# Patient Record
Sex: Female | Born: 1975 | Race: White | Hispanic: No | Marital: Single | Smoking: Never smoker
Health system: Southern US, Community
[De-identification: ages and names within clinical notes are randomized; demographics above are authoritative.]

## PROBLEM LIST (undated history)

## (undated) DIAGNOSIS — E039 Hypothyroidism, unspecified: Secondary | ICD-10-CM

## (undated) DIAGNOSIS — Q969 Turner's syndrome, unspecified: Secondary | ICD-10-CM

## (undated) DIAGNOSIS — M810 Age-related osteoporosis without current pathological fracture: Secondary | ICD-10-CM

## (undated) DIAGNOSIS — I10 Essential (primary) hypertension: Secondary | ICD-10-CM

## (undated) DIAGNOSIS — A0472 Enterocolitis due to Clostridium difficile, not specified as recurrent: Secondary | ICD-10-CM

## (undated) HISTORY — DX: Enterocolitis due to Clostridium difficile, not specified as recurrent: A04.72

## (undated) HISTORY — PX: NASAL SINUS SURGERY: SHX719

## (undated) HISTORY — DX: Age-related osteoporosis without current pathological fracture: M81.0

## (undated) HISTORY — PX: OTHER SURGICAL HISTORY: SHX169

## (undated) HISTORY — DX: Hypothyroidism, unspecified: E03.9

## (undated) HISTORY — DX: Essential (primary) hypertension: I10

## (undated) HISTORY — DX: Turner's syndrome, unspecified: Q96.9

---

## 1997-07-16 ENCOUNTER — Other Ambulatory Visit: Admission: RE | Admit: 1997-07-16 | Discharge: 1997-07-16 | Payer: Self-pay | Admitting: Obstetrics and Gynecology

## 1997-09-08 ENCOUNTER — Other Ambulatory Visit: Admission: RE | Admit: 1997-09-08 | Discharge: 1997-09-08 | Payer: Self-pay | Admitting: Otolaryngology

## 1998-09-20 ENCOUNTER — Other Ambulatory Visit: Admission: RE | Admit: 1998-09-20 | Discharge: 1998-09-20 | Payer: Self-pay | Admitting: Obstetrics and Gynecology

## 1999-10-02 ENCOUNTER — Other Ambulatory Visit: Admission: RE | Admit: 1999-10-02 | Discharge: 1999-10-02 | Payer: Self-pay | Admitting: Obstetrics and Gynecology

## 2000-11-29 ENCOUNTER — Other Ambulatory Visit: Admission: RE | Admit: 2000-11-29 | Discharge: 2000-11-29 | Payer: Self-pay | Admitting: Obstetrics and Gynecology

## 2001-12-02 ENCOUNTER — Other Ambulatory Visit: Admission: RE | Admit: 2001-12-02 | Discharge: 2001-12-02 | Payer: Self-pay | Admitting: Obstetrics and Gynecology

## 2003-03-09 ENCOUNTER — Other Ambulatory Visit: Admission: RE | Admit: 2003-03-09 | Discharge: 2003-03-09 | Payer: Self-pay | Admitting: Obstetrics and Gynecology

## 2004-07-10 ENCOUNTER — Other Ambulatory Visit: Admission: RE | Admit: 2004-07-10 | Discharge: 2004-07-10 | Payer: Self-pay | Admitting: Obstetrics and Gynecology

## 2005-07-17 ENCOUNTER — Other Ambulatory Visit: Admission: RE | Admit: 2005-07-17 | Discharge: 2005-07-17 | Payer: Self-pay | Admitting: Obstetrics and Gynecology

## 2006-07-22 ENCOUNTER — Other Ambulatory Visit: Admission: RE | Admit: 2006-07-22 | Discharge: 2006-07-22 | Payer: Self-pay | Admitting: Obstetrics & Gynecology

## 2007-08-06 ENCOUNTER — Other Ambulatory Visit: Admission: RE | Admit: 2007-08-06 | Discharge: 2007-08-06 | Payer: Self-pay | Admitting: Obstetrics and Gynecology

## 2008-10-01 ENCOUNTER — Encounter: Payer: Self-pay | Admitting: Cardiovascular Disease

## 2009-01-22 DIAGNOSIS — A0472 Enterocolitis due to Clostridium difficile, not specified as recurrent: Secondary | ICD-10-CM | POA: Insufficient documentation

## 2009-01-22 HISTORY — DX: Enterocolitis due to Clostridium difficile, not specified as recurrent: A04.72

## 2009-02-04 ENCOUNTER — Ambulatory Visit: Payer: Self-pay | Admitting: Family Medicine

## 2009-02-04 ENCOUNTER — Ambulatory Visit: Payer: Self-pay | Admitting: Pulmonary Disease

## 2009-02-04 ENCOUNTER — Inpatient Hospital Stay (HOSPITAL_COMMUNITY): Admission: EM | Admit: 2009-02-04 | Discharge: 2009-02-10 | Payer: Self-pay | Admitting: Family Medicine

## 2009-02-06 ENCOUNTER — Encounter: Payer: Self-pay | Admitting: Family Medicine

## 2009-02-07 ENCOUNTER — Ambulatory Visit: Payer: Self-pay | Admitting: Gastroenterology

## 2010-04-09 LAB — PREGNANCY, URINE: Preg Test, Ur: NEGATIVE

## 2010-04-09 LAB — CLOSTRIDIUM DIFFICILE EIA: C difficile Toxins A+B, EIA: NEGATIVE

## 2010-04-09 LAB — HEPATIC FUNCTION PANEL
ALT: 11 U/L (ref 0–35)
AST: 16 U/L (ref 0–37)
Albumin: 2.6 g/dL — ABNORMAL LOW (ref 3.5–5.2)
Bilirubin, Direct: <0.1 mg/dL (ref 0.0–0.3)
Total Bilirubin: 0.3 mg/dL (ref 0.3–1.2)

## 2010-04-09 LAB — CBC
Hemoglobin: 11.9 g/dL — ABNORMAL LOW (ref 12.0–15.0)
MCHC: 34.4 g/dL (ref 30.0–36.0)
MCV: 91.6 fL (ref 78.0–100.0)
Platelets: 298 10*3/uL (ref 150–400)
RBC: 3.6 MIL/uL — ABNORMAL LOW (ref 3.87–5.11)
RDW: 12.2 % (ref 11.5–15.5)
RDW: 12.9 % (ref 11.5–15.5)
WBC: 13.5 10*3/uL — ABNORMAL HIGH (ref 4.0–10.5)

## 2010-04-09 LAB — STOOL CULTURE

## 2010-04-09 LAB — BASIC METABOLIC PANEL
BUN: 3 mg/dL — ABNORMAL LOW (ref 6–23)
BUN: 3 mg/dL — ABNORMAL LOW (ref 6–23)
CO2: 23 mEq/L (ref 19–32)
CO2: 24 mEq/L (ref 19–32)
Calcium: 7.8 mg/dL — ABNORMAL LOW (ref 8.4–10.5)
Chloride: 102 mEq/L (ref 96–112)
Creatinine, Ser: 0.69 mg/dL (ref 0.4–1.2)
GFR calc Af Amer: >60 mL/min (ref >60)
Glucose, Bld: 104 mg/dL — ABNORMAL HIGH (ref 70–99)
Sodium: 136 mEq/L (ref 135–145)

## 2010-04-10 LAB — BASIC METABOLIC PANEL
BUN: <1 mg/dL — ABNORMAL LOW (ref 6–23)
CO2: 15 mEq/L — ABNORMAL LOW (ref 19–32)
CO2: 15 mEq/L — ABNORMAL LOW (ref 19–32)
CO2: 18 mEq/L — ABNORMAL LOW (ref 19–32)
CO2: 20 mEq/L (ref 19–32)
CO2: 22 mEq/L (ref 19–32)
Calcium: 5.7 mg/dL — CL (ref 8.4–10.5)
Calcium: 7.2 mg/dL — ABNORMAL LOW (ref 8.4–10.5)
Chloride: 111 mEq/L (ref 96–112)
Chloride: 114 mEq/L — ABNORMAL HIGH (ref 96–112)
Chloride: 117 mEq/L — ABNORMAL HIGH (ref 96–112)
Creatinine, Ser: 0.5 mg/dL (ref 0.4–1.2)
Creatinine, Ser: 0.54 mg/dL (ref 0.4–1.2)
Creatinine, Ser: 0.66 mg/dL (ref 0.4–1.2)
Creatinine, Ser: 0.69 mg/dL (ref 0.4–1.2)
GFR calc Af Amer: >60 mL/min (ref >60)
GFR calc Af Amer: >60 mL/min (ref >60)
GFR calc Af Amer: >60 mL/min (ref >60)
GFR calc Af Amer: >60 mL/min (ref >60)
GFR calc non Af Amer: >60 mL/min (ref >60)
GFR calc non Af Amer: >60 mL/min (ref >60)
GFR calc non Af Amer: >60 mL/min (ref >60)
Glucose, Bld: 101 mg/dL — ABNORMAL HIGH (ref 70–99)
Glucose, Bld: 130 mg/dL — ABNORMAL HIGH (ref 70–99)
Glucose, Bld: 130 mg/dL — ABNORMAL HIGH (ref 70–99)
Potassium: 3 mEq/L — ABNORMAL LOW (ref 3.5–5.1)
Potassium: 3.1 mEq/L — ABNORMAL LOW (ref 3.5–5.1)
Potassium: 3.6 mEq/L (ref 3.5–5.1)
Sodium: 137 mEq/L (ref 135–145)
Sodium: 137 mEq/L (ref 135–145)
Sodium: 138 mEq/L (ref 135–145)
Sodium: 138 mEq/L (ref 135–145)

## 2010-04-10 LAB — CBC
HCT: 32.2 % — ABNORMAL LOW (ref 36.0–46.0)
HCT: 32.8 % — ABNORMAL LOW (ref 36.0–46.0)
HCT: 36.4 % (ref 36.0–46.0)
Hemoglobin: 10.8 g/dL — ABNORMAL LOW (ref 12.0–15.0)
Hemoglobin: 11.3 g/dL — ABNORMAL LOW (ref 12.0–15.0)
Hemoglobin: 11.4 g/dL — ABNORMAL LOW (ref 12.0–15.0)
Hemoglobin: 12.1 g/dL (ref 12.0–15.0)
MCHC: 34.8 g/dL (ref 30.0–36.0)
MCV: 90.7 fL (ref 78.0–100.0)
MCV: 91.8 fL (ref 78.0–100.0)
MCV: 93.7 fL (ref 78.0–100.0)
Platelets: 309 10*3/uL (ref 150–400)
RBC: 3.5 MIL/uL — ABNORMAL LOW (ref 3.87–5.11)
RBC: 3.5 MIL/uL — ABNORMAL LOW (ref 3.87–5.11)
RBC: 3.62 MIL/uL — ABNORMAL LOW (ref 3.87–5.11)
RDW: 13 % (ref 11.5–15.5)
RDW: 13.3 % (ref 11.5–15.5)
WBC: 11.9 10*3/uL — ABNORMAL HIGH (ref 4.0–10.5)
WBC: 17.2 10*3/uL — ABNORMAL HIGH (ref 4.0–10.5)
WBC: 17.8 10*3/uL — ABNORMAL HIGH (ref 4.0–10.5)
WBC: 8.6 10*3/uL (ref 4.0–10.5)

## 2010-04-10 LAB — COMPREHENSIVE METABOLIC PANEL
AST: 30 U/L (ref 0–37)
BUN: <1 mg/dL — ABNORMAL LOW (ref 6–23)
CO2: 23 mEq/L (ref 19–32)
Chloride: 111 mEq/L (ref 96–112)
Creatinine, Ser: 0.49 mg/dL (ref 0.4–1.2)
GFR calc non Af Amer: >60 mL/min (ref >60)
Glucose, Bld: 127 mg/dL — ABNORMAL HIGH (ref 70–99)
Total Bilirubin: 0.5 mg/dL (ref 0.3–1.2)

## 2010-04-10 LAB — HEPATIC FUNCTION PANEL
ALT: 18 U/L (ref 0–35)
Alkaline Phosphatase: 53 U/L (ref 39–117)
Total Bilirubin: 0.2 mg/dL — ABNORMAL LOW (ref 0.3–1.2)
Total Protein: 4.4 g/dL — ABNORMAL LOW (ref 6.0–8.3)

## 2010-04-10 LAB — MRSA PCR SCREENING

## 2010-04-10 LAB — CLOSTRIDIUM DIFFICILE EIA

## 2010-04-10 LAB — CALCIUM, IONIZED: Calcium, Ion: 0.91 mmol/L — ABNORMAL LOW (ref 1.12–1.32)

## 2010-04-10 LAB — PHOSPHORUS
Phosphorus: 1.6 mg/dL — ABNORMAL LOW (ref 2.3–4.6)
Phosphorus: 2 mg/dL — ABNORMAL LOW (ref 2.3–4.6)

## 2010-04-10 LAB — ALBUMIN: Albumin: 2 g/dL — ABNORMAL LOW (ref 3.5–5.2)

## 2010-04-10 LAB — PROTIME-INR: INR: 1.47 (ref 0.00–1.49)

## 2010-04-10 LAB — T4, FREE: Free T4: 1.39 ng/dL (ref 0.80–1.80)

## 2010-04-10 LAB — TSH: TSH: 2.143 u[IU]/mL (ref 0.350–4.500)

## 2010-12-28 ENCOUNTER — Ambulatory Visit (INDEPENDENT_AMBULATORY_CARE_PROVIDER_SITE_OTHER): Payer: BC Managed Care – PPO

## 2010-12-28 DIAGNOSIS — R059 Cough, unspecified: Secondary | ICD-10-CM

## 2010-12-28 DIAGNOSIS — R05 Cough: Secondary | ICD-10-CM

## 2010-12-28 DIAGNOSIS — R509 Fever, unspecified: Secondary | ICD-10-CM

## 2010-12-28 DIAGNOSIS — J111 Influenza due to unidentified influenza virus with other respiratory manifestations: Secondary | ICD-10-CM

## 2011-05-12 ENCOUNTER — Ambulatory Visit (INDEPENDENT_AMBULATORY_CARE_PROVIDER_SITE_OTHER): Payer: BC Managed Care – PPO | Admitting: Family Medicine

## 2011-05-12 ENCOUNTER — Encounter: Payer: Self-pay | Admitting: Family Medicine

## 2011-05-12 VITALS — BP 113/80 | HR 112 | Temp 98.2°F | Resp 16 | Ht <= 58 in | Wt 150.4 lb

## 2011-05-12 DIAGNOSIS — J309 Allergic rhinitis, unspecified: Secondary | ICD-10-CM

## 2011-05-12 DIAGNOSIS — J329 Chronic sinusitis, unspecified: Secondary | ICD-10-CM

## 2011-05-12 DIAGNOSIS — Z9109 Other allergy status, other than to drugs and biological substances: Secondary | ICD-10-CM

## 2011-05-12 MED ORDER — DOXYCYCLINE HYCLATE 100 MG PO TABS: 100.0000 mg | ORAL_TABLET | Freq: Two times a day (BID) | ORAL | Status: AC

## 2011-05-12 MED ORDER — FLUTICASONE PROPIONATE 50 MCG/ACT NA SUSP: 2.0000 | Freq: Every day | NASAL | Status: DC

## 2011-05-12 MED ORDER — METHYLPREDNISOLONE 4 MG PO KIT: PACK | ORAL | Status: AC

## 2011-05-12 NOTE — Patient Instructions (Signed)

## 2011-05-12 NOTE — Progress Notes (Signed)
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 No fever.  No h/o asthma.  Positive h/o allergies.  Positive for C. Diff  O:  NAD, hoarse, overweight.  Turner's syndrome habitus Eyes: reddened lid margins and mild conjunctival injection TM's:  Markedly deformed left Oroph: clear Nose: purulent nasal discharge Neck: supple,no adenop or thyromegaly Chest: clear  A: sinusitis, allergies  P:  Medrol dospak, flonase Call if no better in 24 hours

## 2011-06-23 ENCOUNTER — Ambulatory Visit (INDEPENDENT_AMBULATORY_CARE_PROVIDER_SITE_OTHER): Payer: BC Managed Care – PPO | Admitting: Emergency Medicine

## 2011-06-23 VITALS — BP 131/92 | HR 108 | Temp 99.5°F | Resp 16 | Ht <= 58 in | Wt 155.0 lb

## 2011-06-23 DIAGNOSIS — J329 Chronic sinusitis, unspecified: Secondary | ICD-10-CM

## 2011-06-23 MED ORDER — DOXYCYCLINE HYCLATE 100 MG PO CAPS: 100.0000 mg | ORAL_CAPSULE | Freq: Two times a day (BID) | ORAL | Status: AC

## 2011-06-23 NOTE — Progress Notes (Signed)
  Subjective:    Patient ID: Mercedes Barry, female    DOB: Jan 06, 1976, 36 y.o.   MRN: [id number]  HPI patient was brought to 6 weeks ago and treated as a sinus infection. She now has recurrent symptoms or purulent nasal drainage associated with a sore throat low-grade fever. She has had a mild cough. She has a history of previous sinus surgery she has a history of C. difficile while taking Levaquin 2-1/2 years ago    Review of Systems     Objective:   Physical Exam  Constitutional: She appears well-developed and well-nourished.  HENT:       There is scarring present both TMs. There is purulent drainage in the nares worse on the left .  Eyes: Pupils are equal, round, and reactive to light.  Neck: Normal range of motion. No thyromegaly present.  Pulmonary/Chest: Effort normal. No respiratory distress. She has no wheezes. She has no rales.          Assessment & Plan:  Patient has acute sinusitis. Will treat doxy x2 weeks. She is to be on probiotics at the same time. We'll make an outpatient referral to Dr. Lazarus Salines to evaluate because of her recurrent episodes.

## 2012-03-18 ENCOUNTER — Ambulatory Visit (INDEPENDENT_AMBULATORY_CARE_PROVIDER_SITE_OTHER): Payer: BC Managed Care – PPO | Admitting: Family Medicine

## 2012-03-18 VITALS — BP 128/84 | HR 91 | Temp 98.5°F | Resp 16 | Ht <= 58 in | Wt 113.0 lb

## 2012-03-18 DIAGNOSIS — Z9109 Other allergy status, other than to drugs and biological substances: Secondary | ICD-10-CM

## 2012-03-18 DIAGNOSIS — J019 Acute sinusitis, unspecified: Secondary | ICD-10-CM

## 2012-03-18 MED ORDER — IPRATROPIUM BROMIDE 0.03 % NA SOLN: 2.0000 | Freq: Four times a day (QID) | NASAL | Status: DC

## 2012-03-18 MED ORDER — FLUTICASONE PROPIONATE 50 MCG/ACT NA SUSP: 2.0000 | Freq: Every day | NASAL | Status: DC

## 2012-03-18 MED ORDER — AZITHROMYCIN 250 MG PO TABS: ORAL_TABLET | ORAL | Status: DC

## 2012-03-18 NOTE — Progress Notes (Signed)
Subjective:    Patient ID: Mercedes Barry, female    DOB: [birth year], 37 y.o.   MRN: [id number] Chief Complaint  Patient presents with  . Headache    all symptoms x 2 weeks  . sinus pressure  . Generalized Body Aches  . Fatigue    HPI  Thinks she has a sinus infection.  Sxs began 2 wks ago - she tried to fight it off, was sleeping a lot but sxs just never went away fully and this morning felt worse. Felt feverish and chilled but no documented temp. Lots of HAs, nasal congestion, sinus pressure, and ears clogged. Does have a sore throat and a dry cough. No teeth pain. No SHoB or CP.  H/O c. Diff after antibiotics for a sinus infection about 3 yrs ago - was hospitalized for long time, close to death!  Past Medical History  Diagnosis Date  . Turner syndrome   . Hypertension   . Hypothyroidism   . Vitamin D deficiency   . Osteoporosis    Current Outpatient Prescriptions on File Prior to Visit  Medication Sig Dispense Refill  . Calcium Carbonate-Vitamin D (CALCIUM 600 + D PO) Take by mouth 2 (two) times daily.      Marland Kitchen desogestrel-ethinyl estradiol (APRI,EMOQUETTE,SOLIA) 0.15-30 MG-MCG tablet Take 1 tablet by mouth daily.      Marland Kitchen levothyroxine (SYNTHROID, LEVOTHROID) 100 MCG tablet Take 100 mcg by mouth daily.      . Multiple Vitamin (MULTIVITAMIN) tablet Take 1 tablet by mouth daily.      . Vitamin D, Ergocalciferol, (DRISDOL) 50000 UNITS CAPS Take 50,000 Units by mouth.      Marland Kitchen alendronate (FOSAMAX) 70 MG tablet Take 70 mg by mouth every 7 (seven) days. Take with a full glass of water on an empty stomach.      Marland Kitchen lisinopril-hydrochlorothiazide (PRINZIDE,ZESTORETIC) 10-12.5 MG per tablet Take 1 tablet by mouth daily.       No current facility-administered medications on file prior to visit.   Allergies  Allergen Reactions  . Cephalosporins   . Entex   . Penicillins        Review of Systems  Constitutional: Positive for chills, diaphoresis, activity change, appetite change and  fatigue. Negative for fever.  HENT: Positive for ear pain, congestion, sore throat, rhinorrhea, sneezing, postnasal drip and sinus pressure. Negative for nosebleeds, trouble swallowing, neck pain, neck stiffness, dental problem, voice change and ear discharge.   Eyes: Negative for discharge and itching.  Respiratory: Positive for cough. Negative for shortness of breath.   Cardiovascular: Negative for chest pain.  Gastrointestinal: Negative for nausea, vomiting, abdominal pain, diarrhea and constipation.  Skin: Negative for rash.  Neurological: Positive for headaches. Negative for dizziness and syncope.  Hematological: Positive for adenopathy.  Psychiatric/Behavioral: Positive for sleep disturbance.      BP 128/84  Pulse 91  Temp(Src) 98.5 F (36.9 C) (Oral)  Resp 16  Ht 4\' 7"  (1.397 m)  Wt 113 lb (51.256 kg)  BMI 26.26 kg/m2  SpO2 99%  LMP 02/26/2012 Objective:   Physical Exam  Constitutional: She is oriented to person, place, and time. She appears well-developed and well-nourished. She is active. She does not appear ill. No distress.  HENT:  Head: Normocephalic and atraumatic.  Right Ear: External ear and ear canal normal. Tympanic membrane is retracted. A middle ear effusion is present.  Left Ear: External ear and ear canal normal. Tympanic membrane is retracted. A middle ear effusion is present.  Nose: Mucosal  edema and rhinorrhea present. Right sinus exhibits maxillary sinus tenderness. Left sinus exhibits maxillary sinus tenderness.  Mouth/Throat: Uvula is midline and mucous membranes are normal. Posterior oropharyngeal erythema present. No oropharyngeal exudate, posterior oropharyngeal edema or tonsillar abscesses.  Eyes: Conjunctivae are normal. Right eye exhibits no discharge. Left eye exhibits no discharge. No scleral icterus.  Neck: Normal range of motion. Neck supple.  Cardiovascular: Normal rate, regular rhythm, normal heart sounds and intact distal pulses.    Pulmonary/Chest: Effort normal and breath sounds normal.  Lymphadenopathy:       Head (right side): Submandibular adenopathy present. No preauricular and no posterior auricular adenopathy present.       Head (left side): Submandibular adenopathy present. No preauricular and no posterior auricular adenopathy present.    She has no cervical adenopathy.       Right: No supraclavicular adenopathy present.       Left: No supraclavicular adenopathy present.  Neurological: She is alert and oriented to person, place, and time.  Skin: Skin is warm and dry. She is not diaphoretic. No erythema.  Psychiatric: She has a normal mood and affect. Her behavior is normal.      Assessment & Plan:  Sinusitis, acute - Plan: azithromycin (ZITHROMAX) 250 MG tablet, fluticasone (FLONASE) 50 MCG/ACT nasal spray, ipratropium (ATROVENT) 0.03 % nasal spray  Meds ordered this encounter  Medications         . azithromycin (ZITHROMAX) 250 MG tablet    Sig: Take 2 tabs PO x 1 dose, then 1 tab PO QD x 4 days    Dispense:  6 tablet    Refill:  0  . fluticasone (FLONASE) 50 MCG/ACT nasal spray    Sig: Place 2 sprays into the nose daily.    Dispense:  1 g    Refill:  0  . ipratropium (ATROVENT) 0.03 % nasal spray    Sig: Place 2 sprays into the nose 4 (four) times daily.    Dispense:  30 mL    Refill:  1

## 2012-03-18 NOTE — Patient Instructions (Signed)
Hot showers or breathing in steam may help loosen the congestion.  Using a netti pot or sinus rinse is also likely to help you feel better and keep this from progressing.  Use the atrovent nasal spray as needed throughout the day and use the fluticasone nasal spray every night before bed for at least 2 weeks. If no improvement or you are getting worse, come back as you might need a course of steroids but hopefully with all of the above, you can avoid it.   Sinusitis Sinusitis is redness, soreness, and swelling (inflammation) of the paranasal sinuses. Paranasal sinuses are air pockets within the bones of your face (beneath the eyes, the middle of the forehead, or above the eyes). In healthy paranasal sinuses, mucus is able to drain out, and air is able to circulate through them by way of your nose. However, when your paranasal sinuses are inflamed, mucus and air can become trapped. This can allow bacteria and other germs to grow and cause infection. Sinusitis can develop quickly and last only a short time (acute) or continue over a long period (chronic). Sinusitis that lasts for more than 12 weeks is considered chronic.  CAUSES  Causes of sinusitis include:  Allergies.  Structural abnormalities, such as displacement of the cartilage that separates your nostrils (deviated septum), which can decrease the air flow through your nose and sinuses and affect sinus drainage.  Functional abnormalities, such as when the small hairs (cilia) that line your sinuses and help remove mucus do not work properly or are not present. SYMPTOMS  Symptoms of acute and chronic sinusitis are the same. The primary symptoms are pain and pressure around the affected sinuses. Other symptoms include:  Upper toothache.  Earache.  Headache.  Bad breath.  Decreased sense of smell and taste.  A cough, which worsens when you are lying flat.  Fatigue.  Fever.  Thick drainage from your nose, which often is green and may  contain pus (purulent).  Swelling and warmth over the affected sinuses. DIAGNOSIS  Your caregiver will perform a physical exam. During the exam, your caregiver may:  Look in your nose for signs of abnormal growths in your nostrils (nasal polyps).  Tap over the affected sinus to check for signs of infection.  View the inside of your sinuses (endoscopy) with a special imaging device with a light attached (endoscope), which is inserted into your sinuses. If your caregiver suspects that you have chronic sinusitis, one or more of the following tests may be recommended:  Allergy tests.  Nasal culture A sample of mucus is taken from your nose and sent to a lab and screened for bacteria.  Nasal cytology A sample of mucus is taken from your nose and examined by your caregiver to determine if your sinusitis is related to an allergy. TREATMENT  Most cases of acute sinusitis are related to a viral infection and will resolve on their own within 10 days. Sometimes medicines are prescribed to help relieve symptoms (pain medicine, decongestants, nasal steroid sprays, or saline sprays).  However, for sinusitis related to a bacterial infection, your caregiver will prescribe antibiotic medicines. These are medicines that will help kill the bacteria causing the infection.  Rarely, sinusitis is caused by a fungal infection. In theses cases, your caregiver will prescribe antifungal medicine. For some cases of chronic sinusitis, surgery is needed. Generally, these are cases in which sinusitis recurs more than 3 times per year, despite other treatments. HOME CARE INSTRUCTIONS   Drink plenty of  water. Water helps thin the mucus so your sinuses can drain more easily.  Use a humidifier.  Inhale steam 3 to 4 times a day (for example, sit in the bathroom with the shower running).  Apply a warm, moist washcloth to your face 3 to 4 times a day, or as directed by your caregiver.  Use saline nasal sprays to help  moisten and clean your sinuses.  Take over-the-counter or prescription medicines for pain, discomfort, or fever only as directed by your caregiver. SEEK IMMEDIATE MEDICAL CARE IF:  You have increasing pain or severe headaches.  You have nausea, vomiting, or drowsiness.  You have swelling around your face.  You have vision problems.  You have a stiff neck.  You have difficulty breathing. MAKE SURE YOU:   Understand these instructions.  Will watch your condition.  Will get help right away if you are not doing well or get worse. Document Released: 01/08/2005 Document Revised: 04/02/2011 Document Reviewed: 01/23/2011 [facility] Patient Information 2013 [place], [place].

## 2012-04-16 ENCOUNTER — Telehealth: Payer: Self-pay | Admitting: Obstetrics & Gynecology

## 2012-04-16 NOTE — Telephone Encounter (Signed)
PT. DID NOT WISH TO LEAVE A MSG Mercedes Barry

## 2012-07-15 ENCOUNTER — Encounter: Payer: Self-pay | Admitting: Obstetrics & Gynecology

## 2012-07-16 ENCOUNTER — Ambulatory Visit: Payer: BC Managed Care – PPO | Admitting: Obstetrics & Gynecology

## 2012-07-18 ENCOUNTER — Encounter: Payer: Self-pay | Admitting: Obstetrics & Gynecology

## 2012-07-18 ENCOUNTER — Ambulatory Visit (INDEPENDENT_AMBULATORY_CARE_PROVIDER_SITE_OTHER): Payer: BC Managed Care – PPO | Admitting: Obstetrics & Gynecology

## 2012-07-18 ENCOUNTER — Telehealth: Payer: Self-pay | Admitting: Obstetrics & Gynecology

## 2012-07-18 VITALS — BP 118/56 | HR 68 | Resp 12 | Ht <= 58 in | Wt 129.6 lb

## 2012-07-18 DIAGNOSIS — Z0000 Encounter for general adult medical examination without abnormal findings: Secondary | ICD-10-CM

## 2012-07-18 DIAGNOSIS — Q969 Turner's syndrome, unspecified: Secondary | ICD-10-CM

## 2012-07-18 DIAGNOSIS — Z202 Contact with and (suspected) exposure to infections with a predominantly sexual mode of transmission: Secondary | ICD-10-CM

## 2012-07-18 DIAGNOSIS — Z01419 Encounter for gynecological examination (general) (routine) without abnormal findings: Secondary | ICD-10-CM

## 2012-07-18 DIAGNOSIS — Z124 Encounter for screening for malignant neoplasm of cervix: Secondary | ICD-10-CM

## 2012-07-18 LAB — HEMOGLOBIN, FINGERSTICK: Hemoglobin, fingerstick: 13.6 g/dL (ref 12.0–16.0)

## 2012-07-18 LAB — RPR

## 2012-07-18 MED ORDER — DESOGESTREL-ETHINYL ESTRADIOL 0.15-30 MG-MCG PO TABS: 1.0000 | ORAL_TABLET | Freq: Every day | ORAL | Status: DC

## 2012-07-18 NOTE — Patient Instructions (Signed)

## 2012-07-18 NOTE — Telephone Encounter (Signed)
Pt wondering if dr Hyacinth Meeker sent her Reclipsen to pharmacy. [facility] on [address] [phone]

## 2012-07-18 NOTE — Progress Notes (Signed)
37 y.o. G0P0000 Single Caucasian Fe here for annual exam.  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  These students stay with her for all three years.  Off for the summer.  Resting and just enjoying being out of school.  Friend/colleague at [employer] has been missing since 6/8.  Patient was last person to see colleague except for boyfriend.    Patient is dating a person.  They are discussing moving in together.  Sexually active.  Bleeds and has pain each time.  Would like to discuss this.  Not using condoms but reports partner has been tested.  Has tried lubricants--[facility] brand.  With weight loss, went off blood pressure but needed still a little so back on but on a lower dosage.  Has had labs with Dr. Yehuda Budd in [place].   Patient's last menstrual period was 07/16/2012.          Sexually active: yes  The current method of family planning is OCP (estrogen/progesterone).    Exercising: yes  workout at gym 2-3x weekly Smoker:  no  Health Maintenance: Pap:  06/14/2011  Negative, neg HR HPV MMG:  never Colonoscopy:  2011 (Sigmoidoscopy with Dr. Loreta Ave, C. Diff) BMD:   07/28/2010 -2.2/-2.8 TDaP:  Pt unsure Labs: hgb- 13.6   reports that she has quit smoking. She has never used smokeless tobacco. She reports that she does not drink alcohol or use illicit drugs.  Past Medical History  Diagnosis Date  . Turner syndrome   . Hypertension   . Hypothyroidism   . Vitamin D deficiency   . Osteoporosis   . Clostridium difficile colitis     Past Surgical History  Procedure Laterality Date  . Left ear reconstruction x2    . Sigmoidoscopy  1/11    [facility])    Current Outpatient Prescriptions  Medication Sig Dispense Refill  . desogestrel-ethinyl estradiol (APRI,EMOQUETTE,SOLIA) 0.15-30 MG-MCG tablet Take 1 tablet by mouth daily.      Marland Kitchen ipratropium (ATROVENT) 0.03 % nasal spray Place 2 sprays into the nose 4 (four) times daily.  30 mL  1  . levothyroxine  (SYNTHROID, LEVOTHROID) 100 MCG tablet Take 100 mcg by mouth daily.      Marland Kitchen lisinopril-hydrochlorothiazide (PRINZIDE,ZESTORETIC) 10-12.5 MG per tablet Take 1 tablet by mouth daily.      . Probiotic Product (PROBIOTIC DAILY PO) Take by mouth.       No current facility-administered medications for this visit.    Family History  Problem Relation Age of Onset  . Osteoporosis Mother     ROS:  Pertinent items are noted in HPI.  Otherwise, a comprehensive ROS was negative.  Exam:   BP 118/56  Pulse 68  Resp 12  Ht 4\' 10"  (1.473 m)  Wt 129 lb 9.6 oz (58.786 kg)  BMI 27.09 kg/m2  LMP 07/16/2012 Height: 4\' 10"  (147.3 cm)  Weight -18lbs from last year Ht Readings from Last 3 Encounters:  07/18/12 4\' 10"  (1.473 m)  03/18/12 4\' 7"  (1.397 m)  06/23/11 4' 9.5" (1.461 m)    General appearance: alert, cooperative and appears stated age Head: Normocephalic, without obvious abnormality, atraumatic Neck: no adenopathy, supple, symmetrical, trachea midline and thyroid normal to inspection and palpation Lungs: clear to auscultation bilaterally Breasts: normal appearance, no masses or tenderness Heart: regular rate and rhythm Abdomen: soft, non-tender; no masses,  no organomegaly Extremities: extremities normal, atraumatic, no cyanosis or edema Skin: Skin color, texture, turgor normal. No rashes or lesions Lymph  nodes: Cervical, supraclavicular, and axillary nodes normal. No abnormal inguinal nodes palpated Neurologic: Grossly normal   Pelvic: External genitalia:  no lesions              Urethra:  normal appearing urethra with no masses, tenderness or lesions              Bartholin's and Skene's: normal                 Vagina: normal appearing vagina with normal color and discharge, no lesions              Cervix: no lesions              Pap taken: no Bimanual Exam:  Uterus:  normal size, contour, position, consistency, mobility, non-tender              Adnexa: normal adnexa and no mass,  fullness, tenderness               Rectovaginal: Confirms               Anus:  normal sphincter tone, no lesions  A:  Well Woman with normal exam Turner's syndrome Osteoporosis H/O C. Diff colitis Hypothyroidism Hearing loss Dyspareunia  On OCPs for HRT/Cycling  P:   Pap smear with neg HR HPV.  Pap today due to new sex partner.  Mammogram recommended starting at age 76 Refer to cardiologist this year for echo to assess aorta.  Pt aware at risk for dissecting aortic aneurysm. Patient knows tetanus is probably due.  She will follow up with Dr. Yehuda Budd. Declines today. BMD next year. Will check about ordering dilators for patient due to painful intercourse. STD testing done today--RPR, HIV, Hep B SAg, GC/Chl. OCP RX to pharmacy.  D/W pt switching to HRT around age 1. return annually or prn  An After Visit Summary was printed and given to the patient.

## 2012-07-18 NOTE — Telephone Encounter (Signed)
Left message on voice mail that yes Dr Hyacinth Meeker sent [facility] to [facility].

## 2012-07-22 LAB — IPS N GONORRHOEA AND CHLAMYDIA BY PCR

## 2012-07-22 NOTE — Telephone Encounter (Signed)
Called patient to schedule Dr. Katherina Right. Patient had wrong phone #. Called [facility]. No appt. With Dr. Elease Hashimoto till Sept. Patient notified of this and request with another doctor who would be available before school started. Appointment given for [facility]ers [facility] for August 19th @ 2:30pm. Patient notified and agreed to this date and time.

## 2012-07-22 NOTE — Telephone Encounter (Signed)
Patient calling to let Dr. Hyacinth Meeker know she prefers [facility], [phone], Dr. Leodis Sias.

## 2012-07-23 ENCOUNTER — Telehealth: Payer: Self-pay

## 2012-07-23 LAB — IPS PAP SMEAR ONLY

## 2012-07-23 NOTE — Telephone Encounter (Signed)
Message copied by Elisha Headland on Wed Jul 23, 2012  4:56 PM ------      Message from: Jerene Bears      Created: Tue Jul 22, 2012  5:18 PM       Inform all std testing negative ------

## 2012-07-23 NOTE — Telephone Encounter (Signed)
Patient notified of all results. 

## 2012-07-23 NOTE — Telephone Encounter (Signed)
7/2 lmtcb//kn

## 2012-07-26 ENCOUNTER — Ambulatory Visit (INDEPENDENT_AMBULATORY_CARE_PROVIDER_SITE_OTHER): Payer: BC Managed Care – PPO | Admitting: Family Medicine

## 2012-07-26 VITALS — BP 110/70 | HR 117 | Temp 98.0°F | Resp 18 | Ht <= 58 in | Wt 130.0 lb

## 2012-07-26 DIAGNOSIS — J329 Chronic sinusitis, unspecified: Secondary | ICD-10-CM

## 2012-07-26 DIAGNOSIS — H9190 Unspecified hearing loss, unspecified ear: Secondary | ICD-10-CM

## 2012-07-26 DIAGNOSIS — H9192 Unspecified hearing loss, left ear: Secondary | ICD-10-CM

## 2012-07-26 DIAGNOSIS — J029 Acute pharyngitis, unspecified: Secondary | ICD-10-CM

## 2012-07-26 DIAGNOSIS — J020 Streptococcal pharyngitis: Secondary | ICD-10-CM

## 2012-07-26 DIAGNOSIS — R0982 Postnasal drip: Secondary | ICD-10-CM

## 2012-07-26 DIAGNOSIS — J019 Acute sinusitis, unspecified: Secondary | ICD-10-CM

## 2012-07-26 LAB — POCT RAPID STREP A (OFFICE): Rapid Strep A Screen: POSITIVE — AB

## 2012-07-26 MED ORDER — AZITHROMYCIN 250 MG PO TABS: ORAL_TABLET | ORAL | Status: DC

## 2012-07-26 MED ORDER — FLUTICASONE PROPIONATE 50 MCG/ACT NA SUSP: 2.0000 | Freq: Every day | NASAL | Status: DC

## 2012-07-26 NOTE — Progress Notes (Signed)
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 Yesterday she got feeling much worse. She's had some sore throat. She has facial pain. Nothing much is coming out. She feels like it's just draining down her throat. Her ears are not giving her any acute problem. She is not having much in the way of cough. She lives alone. She has had sinus problems are currently in the past. She does not smoke. She is also regular medications as noted in her chart. She has a history of having Clostridium difficile in 2011.  Objective: Short stature lady, average weight, in no acute distress. Her TMs are normal on the right. Has a lot of scarring of the left eardrum from previous ear surgery. Nose is congested. Throat has mild tenderness postnasal drainage down the right side primarily right now. Her chest is clear to auscultation. Heart regular without murmurs.  Assessment: Sinusitis with secondary pharyngitis  Plan:   will do a strep test on her.  Results for orders placed in visit on 07/26/12  POCT RAPID STREP A (OFFICE)      Result Value Range   Rapid Strep A Screen Positive (*) Negative   Surprisingly she does have strep along with sinusitis.

## 2012-07-26 NOTE — Patient Instructions (Addendum)
Drink plenty of fluids  Get sufficient rest  You can take an over-the-counter antihistamine decongestant such as Allegra-D or Claritin-D one daily  Use the fluticasone nose spray 2 sprays each nostril twice daily for 3 days, then once daily.  Take the antibiotics as directed.  ENT referral  Strep Throat Strep throat is an infection of the throat caused by a bacteria named Streptococcus pyogenes. Your caregiver may call the infection streptococcal "tonsillitis" or "pharyngitis" depending on whether there are signs of inflammation in the tonsils or back of the throat. Strep throat is most common in children aged 5 15 years during the cold months of the year, but it can occur in people of any age during any season. This infection is spread from person to person (contagious) through coughing, sneezing, or other close contact. SYMPTOMS   Fever or chills.  Painful, swollen, red tonsils or throat.  Pain or difficulty when swallowing.  White or yellow spots on the tonsils or throat.  Swollen, tender lymph nodes or "glands" of the neck or under the jaw.  Red rash all over the body (rare). DIAGNOSIS  Many different infections can cause the same symptoms. A test must be done to confirm the diagnosis so the right treatment can be given. A "rapid strep test" can help your caregiver make the diagnosis in a few minutes. If this test is not available, a light swab of the infected area can be used for a throat culture test. If a throat culture test is done, results are usually available in a day or two. TREATMENT  Strep throat is treated with antibiotic medicine. HOME CARE INSTRUCTIONS   Gargle with 1 tsp of salt in 1 cup of warm water, 3 4 times per day or as needed for comfort.  Family members who also have a sore throat or fever should be tested for strep throat and treated with antibiotics if they have the strep infection.  Make sure everyone in your household washes their hands well.  Do  not share food, drinking cups, or personal items that could cause the infection to spread to others.  You may need to eat a soft food diet until your sore throat gets better.  Drink enough water and fluids to keep your urine clear or pale yellow. This will help prevent dehydration.  Get plenty of rest.  Stay home from school, daycare, or work until you have been on antibiotics for 24 hours.  Only take over-the-counter or prescription medicines for pain, discomfort, or fever as directed by your caregiver.  If antibiotics are prescribed, take them as directed. Finish them even if you start to feel better. SEEK MEDICAL CARE IF:   The glands in your neck continue to enlarge.  You develop a rash, cough, or earache.  You cough up green, yellow-brown, or bloody sputum.  You have pain or discomfort not controlled by medicines.  Your problems seem to be getting worse rather than better. SEEK IMMEDIATE MEDICAL CARE IF:   You develop any new symptoms such as vomiting, severe headache, stiff or painful neck, chest pain, shortness of breath, or trouble swallowing.  You develop severe throat pain, drooling, or changes in your voice.  You develop swelling of the neck, or the skin on the neck becomes red and tender.  You have a fever.  You develop signs of dehydration, such as fatigue, dry mouth, and decreased urination.  You become increasingly sleepy, or you cannot wake up completely. Document Released: 01/06/2000 Document Revised:  12/26/2011 Document Reviewed: 03/09/2010 [employer] Patient Information 2014 [place], [place].

## 2012-08-06 ENCOUNTER — Telehealth: Payer: Self-pay | Admitting: Obstetrics & Gynecology

## 2012-08-06 NOTE — Telephone Encounter (Signed)
Last AEX 07/18/2012 with Dr. Hyacinth Meeker in [facility] Patient calling to check on information and device to order for painful SA. Patient aware Dr. Hyacinth Meeker out of office this week. Patient does have cardiologist appt. On 09/09/2012 with Dr. Olga Millers.

## 2012-08-06 NOTE — Telephone Encounter (Signed)
Patient calling to check if "Dr. Hyacinth Meeker has any more information on device Dr. Hyacinth Meeker was looking into?" Patient aware Dr. Hyacinth Meeker on vacation this week.

## 2012-09-02 ENCOUNTER — Telehealth: Payer: Self-pay | Admitting: *Deleted

## 2012-09-02 NOTE — Telephone Encounter (Signed)
Follow-up call to patient, Dr Hyacinth Meeker recommends consult to discuss dilator kit. Appt scheduled for 09-08-12.

## 2012-09-08 ENCOUNTER — Encounter: Payer: Self-pay | Admitting: Obstetrics & Gynecology

## 2012-09-08 ENCOUNTER — Ambulatory Visit (INDEPENDENT_AMBULATORY_CARE_PROVIDER_SITE_OTHER): Payer: BC Managed Care – PPO | Admitting: Obstetrics & Gynecology

## 2012-09-08 VITALS — BP 126/78 | HR 64 | Resp 12 | Ht <= 58 in | Wt 136.4 lb

## 2012-09-08 DIAGNOSIS — N895 Stricture and atresia of vagina: Secondary | ICD-10-CM

## 2012-09-08 NOTE — Progress Notes (Signed)
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  Has bleeding with every episode of intercourse with associated pain.  This is due to her h/o Turner's Syndrome and vaginal size.  Discussed at [facility] using dilators to help with this.  Have obtained a set of dilators for patient to have.  Instructed on use.  Would prefer to use at least daily, BID if possible.  First two sizes given to patient.  Once she is not having trouble inserting, will advance to next size.  She looked at dilators in set and feels that she may only need the next size up (blue) from what she currently has.  Care for dilators discussed.  All questions answered.  Assessment:  Vaginal stricture  Plan:  Begin using vaginal dilators.   Will plan follow-up PRN.  Pt will communicate with me and let me know when she is ready for next size.

## 2012-09-08 NOTE — Patient Instructions (Signed)
Please call when you are ready for the next size.

## 2012-09-09 ENCOUNTER — Institutional Professional Consult (permissible substitution): Payer: Self-pay | Admitting: Cardiology

## 2012-09-24 ENCOUNTER — Ambulatory Visit: Payer: BC Managed Care – PPO | Admitting: Obstetrics & Gynecology

## 2012-09-25 ENCOUNTER — Institutional Professional Consult (permissible substitution): Payer: Self-pay | Admitting: Cardiovascular Disease

## 2012-11-23 ENCOUNTER — Encounter: Payer: Self-pay | Admitting: Obstetrics & Gynecology

## 2012-11-27 ENCOUNTER — Other Ambulatory Visit: Payer: Self-pay

## 2013-02-10 ENCOUNTER — Ambulatory Visit (INDEPENDENT_AMBULATORY_CARE_PROVIDER_SITE_OTHER): Payer: BC Managed Care – PPO | Admitting: Physician Assistant

## 2013-02-10 VITALS — BP 122/80 | HR 115 | Temp 98.4°F | Resp 16 | Ht <= 58 in | Wt 139.0 lb

## 2013-02-10 DIAGNOSIS — J029 Acute pharyngitis, unspecified: Secondary | ICD-10-CM

## 2013-02-10 DIAGNOSIS — J019 Acute sinusitis, unspecified: Secondary | ICD-10-CM

## 2013-02-10 DIAGNOSIS — R059 Cough, unspecified: Secondary | ICD-10-CM

## 2013-02-10 DIAGNOSIS — R05 Cough: Secondary | ICD-10-CM

## 2013-02-10 LAB — POCT RAPID STREP A (OFFICE): RAPID STREP A SCREEN: NEGATIVE

## 2013-02-10 MED ORDER — IPRATROPIUM BROMIDE 0.06 % NA SOLN: 2.0000 | Freq: Three times a day (TID) | NASAL | Status: DC

## 2013-02-10 MED ORDER — AZITHROMYCIN 250 MG PO TABS: ORAL_TABLET | ORAL | Status: DC

## 2013-02-10 NOTE — Progress Notes (Signed)
Subjective:    Patient ID: Mercedes Barry, female    DOB: 18-May-1975, 38 y.o.   MRN: [id number]  HPI 38 y.o. female presents with 2 week history of nasal congestion, post nasal drip, sore throat, sinus pressure, and cough. Subjective fever and chills. Nasal congestion thick and green/yellow. Sinus pressure is the worst symptom along the maxillary sinuses. Cough is mildly productive of yellow sputum secondary to post nasal drip and worse at nighttime. Ears feel full, leading to sensation of muffled hearing. Has tried OTC cold preps without success. Some diarrhea the previous day, but that seems to have resolved. Appetite decreased. Last sinus infection was 07/26/12. Works as a Runner, broadcasting/film/video, so multiple sick contacts.   No recent antibiotics or recent travels.  No leg trauma, sedentary periods, h/o cancer, or tobacco use.  History of C diff in 2011 after taking back-to-back courses of Levaquin.   PMH: Past Medical History  Diagnosis Date  . Turner syndrome   . Hypertension   . Hypothyroidism   . Vitamin D deficiency   . Osteoporosis   . Clostridium difficile colitis 2011    Home Meds: Prior to Admission medications   Medication Sig Start Date End Date Taking? Authorizing Provider  desogestrel-ethinyl estradiol (APRI,EMOQUETTE,SOLIA) 0.15-30 MG-MCG tablet Take 1 tablet by mouth daily. 07/18/12  Yes Annamaria Boots, MD  levothyroxine (SYNTHROID, LEVOTHROID) 100 MCG tablet Take 100 mcg by mouth daily.   Yes Historical Provider, MD  lisinopril-hydrochlorothiazide (PRINZIDE,ZESTORETIC) 10-12.5 MG per tablet Take 1 tablet by mouth daily.   Yes Historical Provider, MD  Probiotic Product (PROBIOTIC DAILY PO) Take by mouth.   Yes Historical Provider, MD  sertraline (ZOLOFT) 25 MG tablet Take 25 mg by mouth daily.   Yes Historical Provider, MD    Allergies:  Allergies  Allergen Reactions  . Augmentin [Amoxicillin-Pot Clavulanate]   . Cephalosporins   . Entex   . Penicillins     History    Social History  . Marital Status: Single    Spouse Name: N/A    Number of Children: N/A  . Years of Education: N/A   Occupational History  . Not on file.   Social History Main Topics  . Smoking status: Former Games developer  . Smokeless tobacco: Never Used  . Alcohol Use: No  . Drug Use: No  . Sexual Activity: Yes    Birth Control/ Protection: Pill   Other Topics Concern  . Not on file   Social History Narrative  . No narrative on file      Review of Systems  Constitutional: Positive for fever, chills, appetite change and fatigue.  HENT: Positive for congestion, hearing loss, postnasal drip, rhinorrhea, sinus pressure and sore throat. Negative for ear pain.   Respiratory: Positive for cough. Negative for shortness of breath and wheezing.        Mild cough.  Cough is mildly productive of yellow sputum.  Cough is worse at nighttime.   Gastrointestinal: Positive for diarrhea. Negative for nausea and vomiting.       2-3 times the previous day, none today  Musculoskeletal: Negative for myalgias.  Neurological: Positive for headaches.       Sinus headache.        Objective:   Physical Exam  Physical Exam: Blood pressure 122/80, pulse 115, temperature 98.4 F (36.9 C), temperature source Oral, resp. rate 16, height 4\' 9"  (1.448 m), weight 139 lb (63.05 kg), last menstrual period 01/27/2013, SpO2 98.00%., Body mass index is 30.07 kg/(m^2).  General: Well developed, well nourished, in no acute distress. Head: Normocephalic, atraumatic, eyes without discharge, sclera non-icteric, nares are congested. Bilateral auditory canals clear, TM's are without perforation, pearly grey with reflective cone of light bilaterally. Serous effusion bilaterally behind TM's. Maxillary sinus TTP. Oral cavity moist, dentition normal. Posterior pharynx with post nasal drip and mild erythema. No peritonsillar abscess or tonsillar exudate. Uvula midline.  Neck: Supple. No thyromegaly. Full ROM. No  lymphadenopathy. Lungs: Clear bilaterally to auscultation without wheezes, rales, or rhonchi. Breathing is unlabored.  Heart: RRR with S1 S2. No murmurs, rubs, or gallops appreciated. Msk:  Strength and tone normal for age. Extremities: No clubbing or cyanosis. No edema. Neuro: Alert and oriented X 3. Moves all extremities spontaneously. CNII-XII grossly in tact. Psych:  Responds to questions appropriately with a normal affect.   Labs:     Assessment & Plan:  38 year old female with sinusitis and cough - -Atrovent NS 0.06% 2 sprays each nare bid prn #1 no RF -Hycodan #4oz 1 tsp po q 4-6 hours prn cough no RF SED -Take probiotic  -Risks of C diff discussed with patient -Rest/fluids -RTC precautions   Eula Listenyan Rodney Wigger, MHS, PA-C [facility] [address] [place], [place] [address] [phone] [facility] 02/10/2013 5:42 PM

## 2013-06-17 DIAGNOSIS — I10 Essential (primary) hypertension: Secondary | ICD-10-CM | POA: Insufficient documentation

## 2013-06-17 HISTORY — DX: Essential (primary) hypertension: I10

## 2013-07-05 ENCOUNTER — Ambulatory Visit (INDEPENDENT_AMBULATORY_CARE_PROVIDER_SITE_OTHER): Payer: BC Managed Care – PPO | Admitting: Physician Assistant

## 2013-07-05 VITALS — BP 112/80 | HR 99 | Temp 98.5°F | Resp 18 | Ht <= 58 in | Wt 161.0 lb

## 2013-07-05 DIAGNOSIS — R05 Cough: Secondary | ICD-10-CM

## 2013-07-05 DIAGNOSIS — J329 Chronic sinusitis, unspecified: Secondary | ICD-10-CM

## 2013-07-05 DIAGNOSIS — R059 Cough, unspecified: Secondary | ICD-10-CM

## 2013-07-05 DIAGNOSIS — J019 Acute sinusitis, unspecified: Secondary | ICD-10-CM

## 2013-07-05 MED ORDER — AZITHROMYCIN 250 MG PO TABS: ORAL_TABLET | ORAL | Status: DC

## 2013-07-05 MED ORDER — IPRATROPIUM BROMIDE 0.06 % NA SOLN: 2.0000 | Freq: Three times a day (TID) | NASAL | Status: DC

## 2013-07-05 NOTE — Progress Notes (Signed)
   Subjective:    Patient ID: Mercedes Barry, female    DOB: 02/23/1975, 38 y.o.   MRN: [id number]  HPI 38 year old female presents for evaluation of a sinus infection. Has hx of chronic, recurrent sinus infections and has had sinus surgery (2009).  She has had 4-5 day history of sinus pain, fatigue, nasal congestion, rhinorrhea, thick yellow mucous, and PND.  Admits to slight cough but no SOB, wheezing, or chest pain.  Uses Flonase regularly. Has had an rx for Atrovent that has helped in the past. Reports Zpack has "worked well" for her in the past.  She is interested in seeing ENT again for evaluation of possible recurrence of her sinus infections. Has seen Dr. Dorma RussellKraus in the past for ear surgeries, and [facility].    Review of Systems  Constitutional: Negative for fever and chills.  HENT: Positive for congestion, postnasal drip, rhinorrhea and sinus pressure. Negative for ear pain and sore throat.   Respiratory: Positive for cough. Negative for chest tightness, shortness of breath and wheezing.   Cardiovascular: Negative for chest pain.  Gastrointestinal: Negative for nausea and vomiting.  Neurological: Negative for dizziness and headaches.       Objective:   Physical Exam  Constitutional: She is oriented to person, place, and time. She appears well-developed and well-nourished.  HENT:  Head: Normocephalic and atraumatic.  Right Ear: Hearing, tympanic membrane, external ear and ear canal normal.  Left Ear: Hearing, tympanic membrane, external ear and ear canal normal.  Mouth/Throat: Uvula is midline, oropharynx is clear and moist and mucous membranes are normal.  Eyes: Conjunctivae are normal.  Neck: Normal range of motion.  Cardiovascular: Normal rate, regular rhythm and normal heart sounds.   Pulmonary/Chest: Effort normal and breath sounds normal.  Neurological: She is alert and oriented to person, place, and time.  Psychiatric: She has a normal mood and  affect. Her behavior is normal. Judgment and thought content normal.          Assessment & Plan:  Sinus infection - Plan: Ambulatory referral to ENT  Acute sinusitis, unspecified - Plan: azithromycin (ZITHROMAX Z-PAK) 250 MG tablet, ipratropium (ATROVENT) 0.06 % nasal spray  Cough - Plan: azithromycin (ZITHROMAX Z-PAK) 250 MG tablet  Will treat with Zpack as this has worked for her in the past. Discussed that it is not first line for sinus infections. Refilled Atrovent NS to use 2-3 times daily. Ok to continue this to help with rhinorrhea and congestion. Referral to ENT for consultation. Follow up here if symptoms worsen or fail to improve.

## 2013-07-20 ENCOUNTER — Encounter: Payer: Self-pay | Admitting: Obstetrics & Gynecology

## 2013-07-20 ENCOUNTER — Ambulatory Visit (INDEPENDENT_AMBULATORY_CARE_PROVIDER_SITE_OTHER): Payer: BC Managed Care – PPO | Admitting: Obstetrics & Gynecology

## 2013-07-20 VITALS — BP 120/82 | HR 80 | Resp 16 | Ht <= 58 in | Wt 160.2 lb

## 2013-07-20 DIAGNOSIS — Q969 Turner's syndrome, unspecified: Secondary | ICD-10-CM

## 2013-07-20 DIAGNOSIS — IMO0002 Reserved for concepts with insufficient information to code with codable children: Secondary | ICD-10-CM

## 2013-07-20 DIAGNOSIS — Z01419 Encounter for gynecological examination (general) (routine) without abnormal findings: Secondary | ICD-10-CM

## 2013-07-20 DIAGNOSIS — R6889 Other general symptoms and signs: Secondary | ICD-10-CM

## 2013-07-20 DIAGNOSIS — L659 Nonscarring hair loss, unspecified: Secondary | ICD-10-CM

## 2013-07-20 DIAGNOSIS — Z124 Encounter for screening for malignant neoplasm of cervix: Secondary | ICD-10-CM

## 2013-07-20 MED ORDER — DESOGESTREL-ETHINYL ESTRADIOL 0.15-30 MG-MCG PO TABS: 1.0000 | ORAL_TABLET | Freq: Every day | ORAL | Status: DC

## 2013-07-20 NOTE — Progress Notes (Signed)
38 y.o. G0P0000 Engaged CaucasianF here for annual exam.  Getting married in October 2007.  Has a DJ scheduled.  [facility]  Did move in together.  Intercourse has been good.  She does need to use the dilators with some regularity.  Does use a lubricant.  This really is going well for her, though.    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  Saw Dr. Yehuda BuddSpears within the last three months.  Had thyroid tested.  BP was good.  Had RFs for these medications.    Started on Zoloft during the school year due to "bad work situation".  Weaning off now.  She is only taking 25mg  now.  Will see how she feels off.    Patient's last menstrual period was 07/19/2013.          Sexually active: Yes.    The current method of family planning is OCP (estrogen/progesterone).    Exercising: Yes.    walking Smoker:  no  Health Maintenance: Pap:  07/18/12 WNL History of abnormal Pap:  no MMG:  none Colonoscopy:  2011-c-diff-repeat age 38 or PRN BMD:   none TDaP:  Will check with Dr Collins ScotlandSpear Screening Labs: with PCP, Hb today: with PCP, Urine today: not able to give specimen   reports that she has never smoked. She has never used smokeless tobacco. She reports that she does not drink alcohol or use illicit drugs.  Past Medical History  Diagnosis Date  . Turner syndrome   . Hypertension   . Hypothyroidism   . Vitamin D deficiency   . Osteoporosis   . Clostridium difficile colitis 2011    Past Surgical History  Procedure Laterality Date  . Left ear reconstruction x2      Current Outpatient Prescriptions  Medication Sig Dispense Refill  . desogestrel-ethinyl estradiol (APRI,EMOQUETTE,SOLIA) 0.15-30 MG-MCG tablet Take 1 tablet by mouth daily.  1 Package  13  . ipratropium (ATROVENT) 0.06 % nasal spray Place 2 sprays into the nose 3 (three) times daily.  15 mL  5  . levothyroxine (SYNTHROID, LEVOTHROID) 100 MCG tablet Take 100 mcg by mouth daily.      Marland Kitchen.  lisinopril-hydrochlorothiazide (PRINZIDE,ZESTORETIC) 10-12.5 MG per tablet Take 1 tablet by mouth daily.      . Probiotic Product (PROBIOTIC DAILY PO) Take by mouth.      . sertraline (ZOLOFT) 25 MG tablet Take 25 mg by mouth daily.       No current facility-administered medications for this visit.    Family History  Problem Relation Age of Onset  . Osteoporosis Mother     ROS:  Pertinent items are noted in HPI.  Otherwise, a comprehensive ROS was negative.  Exam:   BP 120/82  Pulse 80  Resp 16  Ht 4' 9.25" (1.454 m)  Wt 160 lb 3.2 oz (72.666 kg)  BMI 34.37 kg/m2  LMP 07/19/2013  Weight change: +21#   Height: 4' 9.25" (145.4 cm)  Ht Readings from Last 3 Encounters:  07/20/13 4' 9.25" (1.454 m)  07/05/13 4' 9.5" (1.461 m)  02/10/13 4\' 9"  (1.448 m)    General appearance: alert, cooperative and appears stated age Head: Normocephalic, without obvious abnormality, atraumatic Neck: no adenopathy, supple, symmetrical, trachea midline and thyroid normal to inspection and palpation Lungs: clear to auscultation bilaterally Breasts: normal appearance, no masses or tenderness Heart: regular rate and rhythm Abdomen: soft, non-tender; bowel sounds normal; no masses,  no organomegaly Extremities: extremities normal, atraumatic, no  cyanosis or edema Skin: Skin color, texture, turgor normal. No rashes or lesions Lymph nodes: Cervical, supraclavicular, and axillary nodes normal. No abnormal inguinal nodes palpated Neurologic: Grossly normal   Pelvic: External genitalia:  no lesions              Urethra:  normal appearing urethra with no masses, tenderness or lesions              Bartholins and Skenes: normal                 Vagina: normal appearing vagina with normal color and discharge, no lesions              Cervix: no lesions              Pap taken: Yes.   Bimanual Exam:  Uterus:  normal size, contour, position, consistency, mobility, non-tender              Adnexa: no mass,  fullness, tenderness               Rectovaginal: Confirms               Anus:  normal sphincter tone, no lesions  A:  Well Woman with normal exam  Turner's syndrome  Osteoporosis  H/O C. Diff colitis 2011 Hypothyroidism  Hearing loss  Dyspareunia  On OCPs for HRT/Cycling  Sexually active.  Using dilators with success. Alopecia  P: Pap smear with neg HR HPV 2013.  Pap today due to new sex partner over last year. Mammogram recommended starting at age 38  Refer to cardiologist this year for echo to assess aorta. Pt aware at risk for dissecting aortic aneurysm.  Appt made for pt last year.  She cancelled appt but never rescheduled. Patient knows tetanus is probably due. She will follow up with Dr. Yehuda BuddSpears. Declines today.  Declined last year as well. Labs with Dr. Yehuda BuddSpears.  Will check TSH and total testosterone. BMD this year.  [facility].   OCP RX to pharmacy. D/W pt switching to HRT around age 38.  return annually or prn  An After Visit Summary was printed and given to the patient.

## 2013-07-21 LAB — TSH: TSH: 1.594 u[IU]/mL (ref 0.350–4.500)

## 2013-07-21 LAB — TESTOSTERONE: TESTOSTERONE: 46 ng/dL (ref 10–70)

## 2013-07-22 ENCOUNTER — Encounter: Payer: Self-pay | Admitting: Obstetrics & Gynecology

## 2013-07-22 DIAGNOSIS — L659 Nonscarring hair loss, unspecified: Secondary | ICD-10-CM

## 2013-07-23 LAB — IPS PAP SMEAR ONLY

## 2013-07-27 ENCOUNTER — Telehealth: Payer: Self-pay

## 2013-07-27 NOTE — Telephone Encounter (Signed)
Patient notified of BMD appointment scheduled for 9:30 on 07/31/13 at [facility]

## 2013-07-27 NOTE — Addendum Note (Signed)
Addended by: Jerene BearsMILLER, Yonael Tulloch S on: 07/27/2013 06:18 AM   Modules accepted: Orders

## 2013-07-28 LAB — IPS HPV ON A LIQUID BASED SPECIMEN

## 2013-08-05 ENCOUNTER — Telehealth: Payer: Self-pay | Admitting: Obstetrics & Gynecology

## 2013-08-05 NOTE — Telephone Encounter (Signed)
Left message for patient to call back. Need to advise that she is scheduled with Dr Terri PiedraLupton 09.01 at 315.

## 2013-08-06 NOTE — Telephone Encounter (Signed)
Returning a call to Sabrina. °

## 2013-08-07 ENCOUNTER — Telehealth: Payer: Self-pay | Admitting: Obstetrics & Gynecology

## 2013-08-07 NOTE — Telephone Encounter (Signed)
Spoke with patient, advised that she is scheduled with Dr Terri PiedraLupton 09.01.2015 at 1515. Patient agreeable.

## 2013-08-10 ENCOUNTER — Telehealth: Payer: Self-pay | Admitting: *Deleted

## 2013-08-10 NOTE — Telephone Encounter (Signed)
Patient notified f Bone Density result. - Verbalized understanding. - Please refer to Bone Density Scan.

## 2013-09-25 ENCOUNTER — Ambulatory Visit (INDEPENDENT_AMBULATORY_CARE_PROVIDER_SITE_OTHER): Payer: BC Managed Care – PPO | Admitting: Cardiovascular Disease

## 2013-09-25 ENCOUNTER — Encounter: Payer: Self-pay | Admitting: Cardiovascular Disease

## 2013-09-25 VITALS — BP 126/90 | HR 95 | Ht <= 58 in | Wt 166.0 lb

## 2013-09-25 DIAGNOSIS — Q969 Turner's syndrome, unspecified: Secondary | ICD-10-CM

## 2013-09-25 DIAGNOSIS — I10 Essential (primary) hypertension: Secondary | ICD-10-CM

## 2013-09-25 HISTORY — DX: Turner's syndrome, unspecified: Q96.9

## 2013-09-25 NOTE — Assessment & Plan Note (Signed)
We'll check a 2-D echocardiogram

## 2013-09-25 NOTE — Patient Instructions (Signed)
  We will see you back in follow up only as needed.   Dr Berry has ordered:  Echocardiogram. Echocardiography is a painless test that uses sound waves to create images of your heart. It provides your doctor with information about the size and shape of your heart and how well your heart's chambers and valves are working. This procedure takes approximately one hour. There are no restrictions for this procedure.      

## 2013-09-25 NOTE — Assessment & Plan Note (Signed)
Controlled on current medications 

## 2013-09-25 NOTE — Progress Notes (Signed)
     09/25/2013 Hyman Bible   Oct 15, 1975  [id number]  Primary Physician Herb Grays, MD Primary Cardiologist: Runell Gess MD Mercedes Barry   HPI:  Mercedes Barry is a very pleasant 38 year old mildly overweight engaged Caucasian female who was referred by Dr. Valentina Shaggy, her OB/GYN for cardiovascular evaluation because of history of Turner's syndrome. She works as a Pension scheme manager.She had a murmur at birth. She has had 2-D echocardiograms sporadically throughout her life. She is currently asymptomatic. Other than hypertension, she has no other cardiovascular risk factors.   Current Outpatient Prescriptions  Medication Sig Dispense Refill  . desogestrel-ethinyl estradiol (RECLIPSEN) 0.15-30 MG-MCG tablet Take 1 tablet by mouth daily.      Marland Kitchen ipratropium (ATROVENT) 0.06 % nasal spray Place 2 sprays into the nose 3 (three) times daily.  15 mL  5  . levothyroxine (SYNTHROID, LEVOTHROID) 100 MCG tablet Take 100 mcg by mouth daily.      Marland Kitchen lisinopril (PRINIVIL,ZESTRIL) 5 MG tablet Take 5 mg by mouth daily.       . Probiotic Product (PROBIOTIC DAILY PO) Take by mouth.       No current facility-administered medications for this visit.    Allergies  Allergen Reactions  . Augmentin [Amoxicillin-Pot Clavulanate]   . Cephalosporins   . Entex   . Penicillins     History   Social History  . Marital Status: Single    Spouse Name: N/A    Number of Children: N/A  . Years of Education: N/A   Occupational History  . Not on file.   Social History Main Topics  . Smoking status: Never Smoker   . Smokeless tobacco: Never Used  . Alcohol Use: No  . Drug Use: No  . Sexual Activity: Yes    Partners: Male    Birth Control/ Protection: Pill   Other Topics Concern  . Not on file   Social History Narrative  . No narrative on file     Review of Systems: General: negative for chills, fever, night sweats or weight changes.  Cardiovascular: negative for chest pain,  dyspnea on exertion, edema, orthopnea, palpitations, paroxysmal nocturnal dyspnea or shortness of breath Dermatological: negative for rash Respiratory: negative for cough or wheezing Urologic: negative for hematuria Abdominal: negative for nausea, vomiting, diarrhea, bright red blood per rectum, melena, or hematemesis Neurologic: negative for visual changes, syncope, or dizziness All other systems reviewed and are otherwise negative except as noted above.    Blood pressure 126/90, pulse 95, height  (1.448 m), weight 166 lb (75.297 kg).  General appearance: alert and no distress Neck: no adenopathy, no carotid bruit, no JVD, supple, symmetrical, trachea midline and thyroid not enlarged, symmetric, no tenderness/mass/nodules Lungs: clear to auscultation bilaterally Heart: regular rate and rhythm, S1, S2 normal, no murmur, click, rub or gallop Extremities: extremities normal, atraumatic, no cyanosis or edema and palpable pedal pulses  EKG normal sinus rhythm at 95 with nonspecific ST and T wave changes  ASSESSMENT AND PLAN:   Essential hypertension Controlled on current medications  Turner's syndrome We'll check a 2-D echocardiogram      Runell Gess MD [facility] 09/25/2013 4:36 PM

## 2013-09-28 ENCOUNTER — Ambulatory Visit (INDEPENDENT_AMBULATORY_CARE_PROVIDER_SITE_OTHER): Payer: BC Managed Care – PPO | Admitting: Physician Assistant

## 2013-09-28 VITALS — BP 110/70 | HR 110 | Temp 99.0°F | Resp 16 | Ht <= 58 in | Wt 163.0 lb

## 2013-09-28 DIAGNOSIS — J0101 Acute recurrent maxillary sinusitis: Secondary | ICD-10-CM

## 2013-09-28 DIAGNOSIS — J029 Acute pharyngitis, unspecified: Secondary | ICD-10-CM

## 2013-09-28 DIAGNOSIS — J0100 Acute maxillary sinusitis, unspecified: Secondary | ICD-10-CM

## 2013-09-28 LAB — POCT RAPID STREP A (OFFICE): RAPID STREP A SCREEN: NEGATIVE

## 2013-09-28 MED ORDER — GUAIFENESIN ER 1200 MG PO TB12: 1.0000 | ORAL_TABLET | Freq: Two times a day (BID) | ORAL | Status: DC | PRN

## 2013-09-28 MED ORDER — FLUTICASONE PROPIONATE 50 MCG/ACT NA SUSP: 2.0000 | Freq: Every day | NASAL | Status: DC

## 2013-09-28 MED ORDER — IPRATROPIUM BROMIDE 0.03 % NA SOLN: 2.0000 | Freq: Two times a day (BID) | NASAL | Status: DC

## 2013-09-28 MED ORDER — DOXYCYCLINE HYCLATE 100 MG PO CAPS: 100.0000 mg | ORAL_CAPSULE | Freq: Two times a day (BID) | ORAL | Status: AC

## 2013-09-28 MED ORDER — BENZONATATE 100 MG PO CAPS: 100.0000 mg | ORAL_CAPSULE | Freq: Three times a day (TID) | ORAL | Status: DC | PRN

## 2013-09-28 NOTE — Patient Instructions (Signed)
Get plenty of rest and drink at least 64 ounces of water daily. 

## 2013-09-28 NOTE — Progress Notes (Signed)
Subjective:    Patient ID: Mercedes Barry, female    DOB: 1975-05-13, 38 y.o.   MRN: [id number]   PCP: Herb Grays, MD (patient is looking for a new PCP that is more convenient to where she now lives)  Chief Complaint  Patient presents with  . Sore Throat    x 2 days  . Headache    x 2 days  . Nasal Congestion    x 10days  . Cough    Medications, allergies, past medical history, surgical history, family history, social history and problem list reviewed and updated.  HPI  This 38 y.o. female presents for evaluation of Upper respiratory symptoms, which she believes is "either a sinus infection or strep." Nasal congestion and cough x 10 days.  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 Fatigued.  No SOB, HA, dizziness. No GI/GU symptoms.  Review of Systems As above.    Objective:   Physical Exam  Constitutional: She appears well-nourished. No distress.  BP 110/70  Pulse 110  Temp(Src) 99 F (37.2 C) (Oral)  Resp 16  Ht  (1.473 m)  Wt 163 lb (73.936 kg)  BMI 34.08 kg/m2  SpO2 98%  LMP 09/14/2013   HENT:  Right Ear: Hearing, external ear and ear canal normal. Tympanic membrane is retracted. Tympanic membrane is not injected and not erythematous.  Left Ear: Hearing, external ear and ear canal normal. Tympanic membrane is scarred. Tympanic membrane is not injected, not erythematous and not bulging.  Nose: Mucosal edema and rhinorrhea present. Right sinus exhibits maxillary sinus tenderness. Right sinus exhibits no frontal sinus tenderness. Left sinus exhibits maxillary sinus tenderness. Left sinus exhibits no frontal sinus tenderness.  Mouth/Throat: Uvula is midline, oropharynx is clear and moist and mucous membranes are normal. Normal dentition. No oropharyngeal exudate.  Neck: Normal range of motion and full passive range of motion without pain. Neck supple.    Cardiovascular: Regular rhythm and normal heart sounds.  Tachycardia present.   Pulmonary/Chest: Effort normal and breath sounds normal.  Skin: Skin is warm, dry and intact.  Psychiatric: She has a normal mood and affect. Her speech is normal and behavior is normal.      Results for orders placed in visit on 09/28/13  POCT RAPID STREP A (OFFICE)      Result Value Ref Range   Rapid Strep A Screen Negative  Negative       Assessment & Plan:  1. Acute recurrent maxillary sinusitis Treat acute infection and restart Flonase for maintenance therapy in hopes of reducing recurrence. - fluticasone (FLONASE) 50 MCG/ACT nasal spray; Place 2 sprays into both nostrils daily.  Dispense: 16 g; Refill: 12 - benzonatate (TESSALON) 100 MG capsule; Take 1-2 capsules (100-200 mg total) by mouth 3 (three) times daily as needed for cough.  Dispense: 40 capsule; Refill: 0 - Guaifenesin (MUCINEX MAXIMUM STRENGTH) 1200 MG TB12; Take 1 tablet (1,200 mg total) by mouth every 12 (twelve) hours as needed.  Dispense: 14 tablet; Refill: 1 - doxycycline (VIBRAMYCIN) 100 MG capsule; Take 1 capsule (100 mg total) by mouth 2 (two) times daily.  Dispense: 20 capsule; Refill: 0 - ipratropium (ATROVENT) 0.03 % nasal spray; Place 2 sprays into both nostrils 2 (two) times daily.  Dispense: 30 mL; Refill: 0  2. Acute pharyngitis, unspecified pharyngitis type Reassured. Await culture. - POCT rapid strep A - Culture, Group A Strep  Fara Chute, PA-C Physician Assistant-Certified [facility]

## 2013-09-30 LAB — CULTURE, GROUP A STREP: Organism ID, Bacteria: NORMAL

## 2013-10-02 ENCOUNTER — Ambulatory Visit (HOSPITAL_COMMUNITY)
Admission: RE | Admit: 2013-10-02 | Discharge: 2013-10-02 | Disposition: A | Discharge status: Home / Self Care | Payer: BC Managed Care – PPO | Source: Ambulatory Visit | Attending: Cardiology | Admitting: Cardiology

## 2013-10-02 DIAGNOSIS — I10 Essential (primary) hypertension: Secondary | ICD-10-CM | POA: Insufficient documentation

## 2013-10-02 DIAGNOSIS — Q969 Turner's syndrome, unspecified: Secondary | ICD-10-CM | POA: Diagnosis not present

## 2013-10-02 NOTE — Progress Notes (Signed)
2D Echocardiogram Complete.  10/02/2013   Channie Bostick, RDCS  

## 2013-12-10 ENCOUNTER — Ambulatory Visit (INDEPENDENT_AMBULATORY_CARE_PROVIDER_SITE_OTHER): Payer: BC Managed Care – PPO | Admitting: Internal Medicine

## 2013-12-10 VITALS — BP 118/82 | HR 108 | Temp 98.3°F | Resp 18 | Ht <= 58 in | Wt 160.2 lb

## 2013-12-10 DIAGNOSIS — J029 Acute pharyngitis, unspecified: Secondary | ICD-10-CM

## 2013-12-10 DIAGNOSIS — J329 Chronic sinusitis, unspecified: Secondary | ICD-10-CM

## 2013-12-10 DIAGNOSIS — R519 Headache, unspecified: Secondary | ICD-10-CM

## 2013-12-10 DIAGNOSIS — R51 Headache: Secondary | ICD-10-CM

## 2013-12-10 LAB — POCT RAPID STREP A (OFFICE): Rapid Strep A Screen: NEGATIVE

## 2013-12-10 MED ORDER — AZITHROMYCIN 500 MG PO TABS: 500.0000 mg | ORAL_TABLET | Freq: Every day | ORAL | Status: DC

## 2013-12-10 NOTE — Progress Notes (Signed)
   Subjective:    Patient ID: Mercedes Barry, female    DOB: 05/23/1975, 38 y.o.   MRN: [id number]  HPI Pt. Is a 38 yo female who presents to the office with a possible sinus infection. She has some facial pain related to the possible sinus infection.  Patient states she has yellow and green discharge from her nose. She has a mild cough. It has been going on for about 10 days. No history of a fever.     Review of Systems     Objective:   Physical Exam  Constitutional: She is oriented to person, place, and time. She appears well-developed and well-nourished. No distress.  HENT:  Head: Normocephalic.  Right Ear: External ear normal.  Left Ear: External ear normal.  Nose: Mucosal edema, rhinorrhea and sinus tenderness present. Right sinus exhibits maxillary sinus tenderness. Right sinus exhibits no frontal sinus tenderness. Left sinus exhibits maxillary sinus tenderness. Left sinus exhibits no frontal sinus tenderness.  Mouth/Throat: Uvula is midline. Uvula swelling present. Posterior oropharyngeal edema and posterior oropharyngeal erythema present.  Eyes: Conjunctivae and EOM are normal.  Cardiovascular: Normal rate, regular rhythm and normal heart sounds.   Pulmonary/Chest: Effort normal and breath sounds normal.  Neurological: She is alert and oriented to person, place, and time. She exhibits normal muscle tone. Coordination normal.  Psychiatric: She has a normal mood and affect. Her behavior is normal. Thought content normal.     Results for orders placed or performed in visit on 12/10/13  POCT rapid strep A  Result Value Ref Range   Rapid Strep A Screen Negative Negative        Assessment & Plan:  Sinusitis/Pharyngitis Zithromax 500mg  x5d

## 2013-12-10 NOTE — Patient Instructions (Signed)

## 2014-01-29 ENCOUNTER — Encounter: Payer: Self-pay | Admitting: Family Medicine

## 2014-01-29 ENCOUNTER — Ambulatory Visit (INDEPENDENT_AMBULATORY_CARE_PROVIDER_SITE_OTHER): Payer: BC Managed Care – PPO | Admitting: Family Medicine

## 2014-01-29 VITALS — BP 124/87 | HR 98 | Temp 97.9°F | Resp 16 | Ht <= 58 in | Wt 166.0 lb

## 2014-01-29 DIAGNOSIS — E559 Vitamin D deficiency, unspecified: Secondary | ICD-10-CM

## 2014-01-29 DIAGNOSIS — L659 Nonscarring hair loss, unspecified: Secondary | ICD-10-CM

## 2014-01-29 DIAGNOSIS — Q969 Turner's syndrome, unspecified: Secondary | ICD-10-CM

## 2014-01-29 DIAGNOSIS — E039 Hypothyroidism, unspecified: Secondary | ICD-10-CM

## 2014-01-29 NOTE — Progress Notes (Addendum)
Subjective:  This chart was scribed for Norberto Sorenson, MD by Carl Best, Medical Scribe. This patient was seen in Room 25 and the patient's care was started at 5:06 PM.   Patient ID: Mercedes Barry, female    DOB: [birth year], 39 y.o.   MRN: [id number] Chief Complaint  Patient presents with  . Establish Care    HPI HPI Comments: Mercedes Barry is a 39 y.o. female with a history of congenital Turner's Syndrome who presents to the [facility] to establish care.  She has not been taking  Synthroid for the past couple of months.  She denies constipation, dry skin, abnormal weight gain, and acne as associated symptoms but states that her hair has been thinning while on the medication.  She does not have any family history of hair loss.  She states that her hair has been growing back and getting thicker since she stopped the medication.  She states that she did not experience any symptoms of hypothyroidism before she was placed on medication.  She is not taking any multivitamins.  She takes Probiotics.  She takes  of Lisinopril QD.  Her last Dexa scan was July 2015.    Her OB/GYN is Dr. Leda Quail at [facility] and her labs were done 6 months ago.  Her cardiologist is Dr. Allison Quarry and she saw her recently where she had an EKG done and was normal.  She was referred to a dermatologist for her hair loss who said her scalp was normal and recommended she use Rogaine to treat her symptoms.    She works as a Pension scheme manager at [employer].    Past Medical History  Diagnosis Date  . Turner syndrome   . Hypertension   . Hypothyroidism   . Vitamin D deficiency   . Osteoporosis   . Clostridium difficile colitis 2011   Past Surgical History  Procedure Laterality Date  . Left ear reconstruction x2    . Nasal sinus surgery     Family History  Problem Relation Age of Onset  . Osteoporosis Mother    History   Social History  . Marital Status:  Single    Spouse Name: engaged-Robby    Number of Children: 0  . Years of Education: N/A   Occupational History  . teacher [employer]   Social History Main Topics  . Smoking status: Never Smoker   . Smokeless tobacco: Never Used  . Alcohol Use: No  . Drug Use: No  . Sexual Activity:    Partners: Male    Birth Control/ Protection: Pill   Other Topics Concern  . Not on file   Social History Narrative   Lives with her fiance, Tommie Raymond. They plan to marry in 10/2015. Her parents live in [place], [place].   Allergies  Allergen Reactions  . Cephalosporins     Patient does not think she is allergic to this-isn't sure why it's on her list.  . Entex Other (See Comments)    "I felt like I was on speed. I thought I was having a heart attack." Tolerates pseudofed.  . Penicillins     Due to reaction to Augmentin, has been advised not to take this.  . Augmentin [Amoxicillin-Pot Clavulanate] Rash    Review of Systems  Gastrointestinal: Negative for constipation.  Psychiatric/Behavioral: Negative for sleep disturbance.    Objective:  BP 124/87 mmHg  Pulse 98  Temp(Src) 97.9 F (36.6 C)  Resp 16  Ht 4' 9.5" (1.461 m)  Wt 166 lb (75.297 kg)  BMI 35.28 kg/m2  SpO2 100%  Physical Exam  Constitutional: She is oriented to person, place, and time. She appears well-developed and well-nourished.  HENT:  Head: Normocephalic and atraumatic.  Eyes: EOM are normal.  Neck: Normal range of motion. No thyroid mass and no thyromegaly present.  Cardiovascular: Regular rhythm, S1 normal, S2 normal and normal heart sounds.  Tachycardia present.  Exam reveals no gallop and no friction rub.   No murmur heard. Pulmonary/Chest: Effort normal and breath sounds normal. No respiratory distress. She has no decreased breath sounds. She has no wheezes. She has no rhonchi. She has no rales.  Musculoskeletal: Normal range of motion.  Lymphadenopathy:    She has no cervical adenopathy.    Neurological: She is alert and oriented to person, place, and time.  Skin: Skin is warm and dry.  Psychiatric: She has a normal mood and affect. Her behavior is normal.  Nursing note and vitals reviewed.   Assessment & Plan:   Hypothyroidism, unspecified hypothyroidism type - Plan: Thyroid Panel With TSH, CBC with Differential, COMPLETE METABOLIC PANEL WITH GFR - tsh elev at 9 so increase levothyroxine from 100 to 112 and recheck in 2-3 mos.  Vitamin D deficiency - Plan: Vitamin D, 25-hydroxy - 21 so start taking a once weekly high dose vitamin D supplement x 6 mos the maintaine on 1000-2000u of over the counter vitamin D daily.  Recheck at next OV  Alopecia - unknown etiology and has seen derm - reconsider rogaine if hair loss continues after levothyroxine adjustment  Turner syndrome - Plan: CBC with Differential, COMPLETE METABOLIC PANEL WITH GFR  Meds ordered this encounter  Medications  . levothyroxine (SYNTHROID, LEVOTHROID) 112 MCG tablet    Sig: Take 1 tablet (112 mcg total) by mouth daily before breakfast.    Dispense:  90 tablet    Refill:  1    I personally performed the services described in this documentation, which was scribed in my presence. The recorded information has been reviewed and considered, and addended by me as needed.  Norberto SorensonEva Alene Bergerson, MD MPH  Results for orders placed or performed in visit on 01/29/14  Vitamin D, 25-hydroxy  Result Value Ref Range   Vit D, 25-Hydroxy  30 - 100 ng/mL  Thyroid Panel With TSH  Result Value Ref Range   T4, Total 9.0 4.5 - 12.0 ug/dL   T3 Uptake 25 22 - 35 %   Free Thyroxine Index 2.3 1.4 - 3.8   TSH 9.954 (H) 0.350 - 4.500 uIU/mL  CBC with Differential  Result Value Ref Range   WBC  4.0 - 10.5 K/uL   RBC  3.87 - 5.11 MIL/uL   Hemoglobin  12.0 - 15.0 g/dL   HCT  96.036.0 - 45.446.0 %   MCV  78.0 - 100.0 fL   MCH  26.0 - 34.0 pg   MCHC  30.0 - 36.0 g/dL   RDW  09.811.5 - 11.915.5 %   Platelets  150 - 400 K/uL   MPV  8.6 - 12.4 fL    Neutrophils Relative %  43 - 77 %   Neutro Abs  1.7 - 7.7 K/uL   Lymphocytes Relative  12 - 46 %   Lymphs Abs  0.7 - 4.0 K/uL   Monocytes Relative  3 - 12 %   Monocytes Absolute  0.1 - 1.0 K/uL   Eosinophils Relative  0 -  5 %   Eosinophils Absolute  0.0 - 0.7 K/uL   Basophils Relative  0 - 1 %   Basophils Absolute  0.0 - 0.1 K/uL   Smear Review    COMPLETE METABOLIC PANEL WITH GFR  Result Value Ref Range   Sodium 141 135 - 145 mEq/L   Potassium 4.0 3.5 - 5.3 mEq/L   Chloride 104 96 - 112 mEq/L   CO2 27 19 - 32 mEq/L   Glucose, Bld 86 70 - 99 mg/dL   BUN 15 6 - 23 mg/dL   Creat 1.61 0.96 - 0.45 mg/dL   Total Bilirubin 0.2 0.2 - 1.2 mg/dL   Alkaline Phosphatase 69 39 - 117 U/L   AST 14 0 - 37 U/L   ALT 11 0 - 35 U/L   Total Protein 6.5 6.0 - 8.3 g/dL   Albumin 3.8 3.5 - 5.2 g/dL   Calcium 9.1 8.4 - 40.9 mg/dL   GFR, Est African American >89 mL/min   GFR, Est Non African American >89 mL/min

## 2014-01-30 LAB — COMPLETE METABOLIC PANEL WITH GFR
ALBUMIN: 3.8 g/dL (ref 3.5–5.2)
ALK PHOS: 69 U/L (ref 39–117)
ALT: 11 U/L (ref 0–35)
AST: 14 U/L (ref 0–37)
BILIRUBIN TOTAL: 0.2 mg/dL (ref 0.2–1.2)
BUN: 15 mg/dL (ref 6–23)
CALCIUM: 9.1 mg/dL (ref 8.4–10.5)
CHLORIDE: 104 meq/L (ref 96–112)
CO2: 27 mEq/L (ref 19–32)
Creat: 0.73 mg/dL (ref 0.50–1.10)
GFR, Est African American: >89 mL/min
GFR, Est Non African American: >89 mL/min
Glucose, Bld: 86 mg/dL (ref 70–99)
Potassium: 4 mEq/L (ref 3.5–5.3)
SODIUM: 141 meq/L (ref 135–145)
Total Protein: 6.5 g/dL (ref 6.0–8.3)

## 2014-01-30 LAB — THYROID PANEL WITH TSH
FREE THYROXINE INDEX: 2.3 (ref 1.4–3.8)
T3 Uptake: 25 % (ref 22–35)
T4, Total: 9 ug/dL (ref 4.5–12.0)
TSH: 9.954 u[IU]/mL — AB (ref 0.350–4.500)

## 2014-01-30 LAB — CBC WITH DIFFERENTIAL/PLATELET
BASOS ABS: 0.1 10*3/uL (ref 0.0–0.1)
BASOS PCT: 1 % (ref 0–1)
EOS ABS: 0.3 10*3/uL (ref 0.0–0.7)
Eosinophils Relative: 4 % (ref 0–5)
HCT: 39.3 % (ref 36.0–46.0)
Hemoglobin: 12.9 g/dL (ref 12.0–15.0)
LYMPHS PCT: 32 % (ref 12–46)
Lymphs Abs: 2.5 10*3/uL (ref 0.7–4.0)
MCH: 29.2 pg (ref 26.0–34.0)
MCHC: 32.8 g/dL (ref 30.0–36.0)
MCV: 88.9 fL (ref 78.0–100.0)
MONO ABS: 0.8 10*3/uL (ref 0.1–1.0)
MPV: 10.4 fL (ref 8.6–12.4)
Monocytes Relative: 10 % (ref 3–12)
Neutro Abs: 4.2 10*3/uL (ref 1.7–7.7)
Neutrophils Relative %: 53 % (ref 43–77)
PLATELETS: 381 10*3/uL (ref 150–400)
RBC: 4.42 MIL/uL (ref 3.87–5.11)
RDW: 13.9 % (ref 11.5–15.5)
WBC: 7.9 10*3/uL (ref 4.0–10.5)

## 2014-01-30 MED ORDER — LEVOTHYROXINE SODIUM 112 MCG PO TABS: 112.0000 ug | ORAL_TABLET | Freq: Every day | ORAL | Status: DC

## 2014-02-01 LAB — VITAMIN D 25 HYDROXY (VIT D DEFICIENCY, FRACTURES): Vit D, 25-Hydroxy: 21 ng/mL — ABNORMAL LOW (ref 30–100)

## 2014-02-02 DIAGNOSIS — E039 Hypothyroidism, unspecified: Secondary | ICD-10-CM

## 2014-02-02 DIAGNOSIS — E559 Vitamin D deficiency, unspecified: Secondary | ICD-10-CM | POA: Insufficient documentation

## 2014-02-02 HISTORY — DX: Hypothyroidism, unspecified: E03.9

## 2014-02-02 MED ORDER — VITAMIN D (ERGOCALCIFEROL) 1.25 MG (50000 UNIT) PO CAPS: 50000.0000 [IU] | ORAL_CAPSULE | ORAL | Status: DC

## 2014-02-02 NOTE — Addendum Note (Signed)
Addended by: Norberto SorensonSHAW, EVA on: 02/02/2014 12:33 PM   Modules accepted: Orders

## 2014-03-23 ENCOUNTER — Ambulatory Visit (INDEPENDENT_AMBULATORY_CARE_PROVIDER_SITE_OTHER): Payer: BC Managed Care – PPO | Admitting: Family Medicine

## 2014-03-23 VITALS — BP 110/78 | HR 116 | Temp 97.5°F | Resp 16 | Ht <= 58 in | Wt 161.6 lb

## 2014-03-23 DIAGNOSIS — B359 Dermatophytosis, unspecified: Secondary | ICD-10-CM

## 2014-03-23 DIAGNOSIS — J302 Other seasonal allergic rhinitis: Secondary | ICD-10-CM | POA: Diagnosis not present

## 2014-03-23 HISTORY — DX: Other seasonal allergic rhinitis: J30.2

## 2014-03-23 MED ORDER — AZELASTINE HCL 0.15 % NA SOLN: 2.0000 | Freq: Two times a day (BID) | NASAL | Status: DC

## 2014-03-23 MED ORDER — CETIRIZINE HCL 10 MG PO TABS: 10.0000 mg | ORAL_TABLET | Freq: Every day | ORAL | Status: DC

## 2014-03-23 MED ORDER — TERBINAFINE HCL 1 % EX CREA: 1.0000 "application " | TOPICAL_CREAM | Freq: Two times a day (BID) | CUTANEOUS | Status: DC

## 2014-03-23 NOTE — Progress Notes (Signed)
03/23/2014 at 5:14 PM  Mercedes Barry / DOB: 06/23/1975 / MRN: [id number]  The patient has Essential hypertension; Turner's syndrome; Vitamin D deficiency; Thyroid activity decreased; and Seasonal allergies on her problem list.  SUBJECTIVE  Chief compalaint: Sore Throat; Cough; and Nasal Congestion   History of present illness: Mercedes Barry is 39 y.o. well appearing female with a history of allergic rhinitis and C. Difficile in January of 2011 presenting for viral URI symptoms which she states is moderate. This started 10 days ago and is stable . Associated symptoms include sneezing, itchy eyes and sinus congestion and mild cough.  She denies fever and mucopurulence. She stopped taken her Flonase roughly 1 month ago.      She  has a past medical history of Turner syndrome; Hypertension; Hypothyroidism; Vitamin D deficiency; Osteoporosis; and Clostridium difficile colitis (2011).    She has a current medication list which includes the following prescription(s): fluticasone, levothyroxine, lisinopril, probiotic product, vitamin d (ergocalciferol), azelastine hcl, cetirizine, and terbinafine.  Mercedes Barry is allergic to cephalosporins; entex; penicillins; and augmentin. She  reports that she has never smoked. She has never used smokeless tobacco. She reports that she does not drink alcohol or use illicit drugs. She  reports that she currently engages in sexual activity and has had female partners. She reports using the following method of birth control/protection: Pill. The patient  has past surgical history that includes left ear reconstruction x2 and Nasal sinus surgery.  Her family history includes Osteoporosis in her mother.  ROS  Per HPI  OBJECTIVE  Her  height is 4' 9.5" (1.461 m) and weight is 161 lb 9.6 oz (73.301 kg). Her oral temperature is 97.5 F (36.4 C). Her blood pressure is 110/78 and her pulse is 116. Her respiration is 16 and oxygen saturation is 97%.  The patient's body mass  index is 34.34 kg/(m^2).  Physical Exam  Constitutional: She is oriented to person, place, and time. She appears well-developed and well-nourished. No distress.  HENT:  Head:    Right Ear: Hearing, tympanic membrane, external ear and ear canal normal.  Left Ear: Hearing, tympanic membrane, external ear and ear canal normal.  Nose: Mucosal edema present. Right sinus exhibits no maxillary sinus tenderness and no frontal sinus tenderness. Left sinus exhibits no maxillary sinus tenderness and no frontal sinus tenderness.  Mouth/Throat: Uvula is midline, oropharynx is clear and moist and mucous membranes are normal.  Cardiovascular: Normal rate, regular rhythm and normal heart sounds.   Respiratory: Effort normal and breath sounds normal. She has no wheezes. She has no rales.  Neurological: She is alert and oriented to person, place, and time.  Skin: Skin is warm and dry. She is not diaphoretic.  Psychiatric: She has a normal mood and affect.    No results found for this or any previous visit (from the past 24 hour(s)).  ASSESSMENT & PLAN  Mercedes Barry was seen today for sore throat, cough and nasal congestion.  Diagnoses and all orders for this visit:  Seasonal allergies vs viral vs bacterial etiology with the latter being most unlikely.  Will cover for congestion and allergies with the below, holding pseudo Orders: -     cetirizine (ZYRTEC) 10 MG tablet; Take 1 tablet (10 mg total) by mouth daily. -     Azelastine HCl 0.15 % SOLN; Place 2 sprays into the nose 2 (two) times daily.  Ringworm Orders: -     terbinafine (LAMISIL) 1 % cream; Apply 1 application topically  2 (two) times daily.   The patient was advised to call or come back to clinic if she does not see an improvement in symptoms, or worsens with the above plan.   Deliah BostonMichael Skiler Tye, MHS, PA-C [facility] 03/23/2014 5:14 PM      \

## 2014-03-23 NOTE — Patient Instructions (Signed)
Please resume your Flonase.   Allergic Rhinitis Allergic rhinitis is when the mucous membranes in the nose respond to allergens. Allergens are particles in the air that cause your body to have an allergic reaction. This causes you to release allergic antibodies. Through a chain of events, these eventually cause you to release histamine into the blood stream. Although meant to protect the body, it is this release of histamine that causes your discomfort, such as frequent sneezing, congestion, and an itchy, runny nose.  CAUSES  Seasonal allergic rhinitis (hay fever) is caused by pollen allergens that may come from grasses, trees, and weeds. Year-round allergic rhinitis (perennial allergic rhinitis) is caused by allergens such as house dust mites, pet dander, and mold spores.  SYMPTOMS   Nasal stuffiness (congestion).  Itchy, runny nose with sneezing and tearing of the eyes. DIAGNOSIS  Your health care provider can help you determine the allergen or allergens that trigger your symptoms. If you and your health care provider are unable to determine the allergen, skin or blood testing may be used. TREATMENT  Allergic rhinitis does not have a cure, but it can be controlled by:  Medicines and allergy shots (immunotherapy).  Avoiding the allergen. Hay fever may often be treated with antihistamines in pill or nasal spray forms. Antihistamines block the effects of histamine. There are over-the-counter medicines that may help with nasal congestion and swelling around the eyes. Check with your health care provider before taking or giving this medicine.  If avoiding the allergen or the medicine prescribed do not work, there are many new medicines your health care provider can prescribe. Stronger medicine may be used if initial measures are ineffective. Desensitizing injections can be used if medicine and avoidance does not work. Desensitization is when a patient is given ongoing shots until the body becomes  less sensitive to the allergen. Make sure you follow up with your health care provider if problems continue. HOME CARE INSTRUCTIONS It is not possible to completely avoid allergens, but you can reduce your symptoms by taking steps to limit your exposure to them. It helps to know exactly what you are allergic to so that you can avoid your specific triggers. SEEK MEDICAL CARE IF:   You have a fever.  You develop a cough that does not stop easily (persistent).  You have shortness of breath.  You start wheezing.  Symptoms interfere with normal daily activities. Document Released: 10/03/2000 Document Revised: 01/13/2013 Document Reviewed: 09/15/2012 [facility] Patient Information 2015 [employer], [place]. This information is not intended to replace advice given to you by your health care provider. Make sure you discuss any questions you have with your health care provider.

## 2014-03-30 NOTE — Progress Notes (Signed)
Pt assessed, reviewed documentation and agree w/ assessment and plan. Sherell Christoffel, MD MPH   

## 2014-04-14 ENCOUNTER — Telehealth: Payer: Self-pay | Admitting: Obstetrics & Gynecology

## 2014-04-14 NOTE — Telephone Encounter (Signed)
Left patient a message to call back to reschedule her AEX appointment with Dr. Hyacinth MeekerMiller from 07/27/14. Can offer 07/28/14.

## 2014-07-04 ENCOUNTER — Ambulatory Visit (INDEPENDENT_AMBULATORY_CARE_PROVIDER_SITE_OTHER): Payer: BC Managed Care – PPO | Admitting: Family Medicine

## 2014-07-04 VITALS — BP 149/98 | HR 91 | Temp 99.1°F | Resp 18 | Ht <= 58 in | Wt 167.0 lb

## 2014-07-04 DIAGNOSIS — E559 Vitamin D deficiency, unspecified: Secondary | ICD-10-CM | POA: Diagnosis not present

## 2014-07-04 DIAGNOSIS — Q969 Turner's syndrome, unspecified: Secondary | ICD-10-CM | POA: Diagnosis not present

## 2014-07-04 DIAGNOSIS — Z79899 Other long term (current) drug therapy: Secondary | ICD-10-CM

## 2014-07-04 DIAGNOSIS — I10 Essential (primary) hypertension: Secondary | ICD-10-CM | POA: Diagnosis not present

## 2014-07-04 DIAGNOSIS — E039 Hypothyroidism, unspecified: Secondary | ICD-10-CM

## 2014-07-04 LAB — COMPREHENSIVE METABOLIC PANEL
ALT: 10 U/L (ref 0–35)
AST: 13 U/L (ref 0–37)
Albumin: 3.7 g/dL (ref 3.5–5.2)
Alkaline Phosphatase: 66 U/L (ref 39–117)
BUN: 10 mg/dL (ref 6–23)
CO2: 24 mEq/L (ref 19–32)
Calcium: 8.9 mg/dL (ref 8.4–10.5)
Chloride: 104 mEq/L (ref 96–112)
Creat: 0.59 mg/dL (ref 0.50–1.10)
Glucose, Bld: 83 mg/dL (ref 70–99)
Potassium: 4.6 mEq/L (ref 3.5–5.3)
SODIUM: 140 meq/L (ref 135–145)
Total Bilirubin: 0.3 mg/dL (ref 0.2–1.2)
Total Protein: 6.2 g/dL (ref 6.0–8.3)

## 2014-07-04 LAB — LIPID PANEL
Cholesterol: 165 mg/dL (ref 0–200)
HDL: 79 mg/dL (ref >=46)
LDL CALC: 69 mg/dL (ref 0–99)
TRIGLYCERIDES: 84 mg/dL (ref <150)
Total CHOL/HDL Ratio: 2.1 Ratio
VLDL: 17 mg/dL (ref 0–40)

## 2014-07-04 LAB — TSH: TSH: 1.779 u[IU]/mL (ref 0.350–4.500)

## 2014-07-04 MED ORDER — LISINOPRIL 5 MG PO TABS: 5.0000 mg | ORAL_TABLET | Freq: Every day | ORAL | Status: DC

## 2014-07-04 NOTE — Progress Notes (Addendum)
Subjective:  This chart was scribed for Norberto Sorenson, MD by Stann Ore, Medical Scribe. This patient was seen in Room 13 and the patient's care was started 8:44 AM.    Patient ID: Mercedes Barry, female    DOB: 03-31-1975, 39 y.o.   MRN: [id number] Chief Complaint  Patient presents with  . Medication Refill    Lisinopril (out of x 2 weeks);  Levothroxin    HPI Mercedes Barry is a 39 y.o. female who presents to [facility] for medication refill.  Pt has congenital abnormality of Turner's syndrome. She has been fasting today.  We increased the Levothroxin from 100 to 112 back 5 months ago.  Last checked, her Vitamin D was too low at 21. She has completed her prescription of Vitamin D for about 2 months.  She also hasn't had a lipid panel.   She reports being off Lisinopril been off for about 2 weeks. She also states that she is not out of the Levothroxin yet but almost out.  She sometimes checks her blood pressure at home with the wrist cuff. It's usually around 120/80 range. She denies cough.   She is still taking Flonase for her seasonal allergies.  She also notes some sunburn around the back of her neck. She tried using neosporin on it.    She is engaged and planned to marry in October of next yr.  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     Past Medical History  Diagnosis Date  . Turner syndrome   . Hypertension   . Hypothyroidism   . Vitamin D deficiency   . Osteoporosis   . Clostridium difficile colitis 2011   Current Outpatient Prescriptions on File Prior to Visit  Medication Sig Dispense Refill  . fluticasone (FLONASE) 50 MCG/ACT nasal spray Place 2 sprays into both nostrils daily. 16 g 12  . levothyroxine (SYNTHROID, LEVOTHROID) 112 MCG tablet Take 1 tablet (112 mcg total) by mouth  daily before breakfast. 90 tablet 1  . Probiotic Product (PROBIOTIC DAILY PO) Take by mouth.    . Azelastine HCl 0.15 % SOLN Place 2 sprays into the nose 2 (two) times daily. (Patient not taking: Reported on 07/04/2014) 30 mL 0  . cetirizine (ZYRTEC) 10 MG tablet Take 1 tablet (10 mg total) by mouth daily. (Patient not taking: Reported on 07/04/2014) 30 tablet 3  . lisinopril (PRINIVIL,ZESTRIL) 5 MG tablet Take 5 mg by mouth daily.     Marland Kitchen terbinafine (LAMISIL) 1 % cream Apply 1 application topically 2 (two) times daily. (Patient not taking: Reported on 07/04/2014) 30 g 0  . Vitamin D, Ergocalciferol, (DRISDOL) 50000 UNITS CAPS capsule Take 1 capsule (50,000 Units total) by mouth every 7 (seven) days. (Patient not taking: Reported on 07/04/2014) 12 capsule 1   No current facility-administered medications on file prior to visit.   Allergies  Allergen Reactions  . Cephalosporins     Patient does not think she is allergic to this-isn't sure why it's on her list.  . Entex Other (See Comments)    "I felt like I was on speed. I thought I was having a heart attack." Tolerates pseudofed.  . Penicillins     Due to reaction to Augmentin, has been advised not  to take this.  . Augmentin [Amoxicillin-Pot Clavulanate] Rash      Review of Systems  Constitutional: Negative for fever, chills, diaphoresis, activity change, appetite change, fatigue and unexpected weight change.  Respiratory: Negative for cough and shortness of breath.   Cardiovascular: Negative for palpitations.  Gastrointestinal: Negative for vomiting, diarrhea and constipation.  Skin: Positive for rash (sunburn back of neck).  Neurological: Negative for dizziness, weakness and light-headedness.  Psychiatric/Behavioral: Negative for sleep disturbance, dysphoric mood and decreased concentration. The patient is not nervous/anxious.        Objective:   Physical Exam  Constitutional: She is oriented to person, place, and time. She appears  well-developed and well-nourished. No distress.  HENT:  Head: Normocephalic and atraumatic.  Eyes: EOM are normal. Pupils are equal, round, and reactive to light.  Neck: Neck supple.  Cardiovascular: Normal rate, regular rhythm and normal heart sounds.   Pulmonary/Chest: Effort normal and breath sounds normal. No respiratory distress.  Musculoskeletal: Normal range of motion.  Neurological: She is alert and oriented to person, place, and time.  Skin: Skin is warm and dry.  Psychiatric: She has a normal mood and affect. Her behavior is normal.  Nursing note and vitals reviewed.   BP 149/98 mmHg  Pulse 91  Temp(Src) 99.1 F (37.3 C) (Oral)  Resp 18  Ht 4' 9.5" (1.461 m)  Wt 167 lb (75.751 kg)  BMI 35.49 kg/m2  SpO2 98%  LMP 06/18/2014 (Exact Date)  Recheck BP in room: 144/90  Depression screen PHQ 2/9 07/04/2014  Decreased Interest 0  Down, Depressed, Hopeless 0  PHQ - 2 Score 0        Assessment & Plan:   1. Essential hypertension - restart lisinopril.  2. Turner's syndrome   3. Vitamin D deficiency - improving, repeat high dose replacement x 6 mos then otc  4. Hypothyroidism, unspecified hypothyroidism type - tsh nml, refilled dose  5. Polypharmacy     Orders Placed This Encounter  Procedures  . TSH  . Lipid panel    Order Specific Question:  Has the patient fasted?    Answer:  Yes  . Comprehensive metabolic panel    Order Specific Question:  Has the patient fasted?    Answer:  Yes  . Vit D  25 hydroxy (rtn osteoporosis monitoring)    Meds ordered this encounter  Medications  . desogestrel-ethinyl estradiol (APRI,EMOQUETTE,SOLIA) 0.15-30 MG-MCG tablet    Sig: Take 1 tablet by mouth daily.  Marland Kitchen lisinopril (PRINIVIL,ZESTRIL) 5 MG tablet    Sig: Take 1 tablet (5 mg total) by mouth daily.    Dispense:  90 tablet    Refill:  2    I personally performed the services described in this documentation, which was scribed in my presence. The recorded  information has been reviewed and considered, and addended by me as needed.  Norberto Sorenson, MD MPH  Results for orders placed or performed in visit on 07/04/14  TSH  Result Value Ref Range   TSH 1.779 0.350 - 4.500 uIU/mL  Lipid panel  Result Value Ref Range   Cholesterol 165 0 - 200 mg/dL   Triglycerides 84 <161 mg/dL   HDL 79 >=09 mg/dL   Total CHOL/HDL Ratio 2.1 Ratio   VLDL 17 0 - 40 mg/dL   LDL Cholesterol 69 0 - 99 mg/dL  Comprehensive metabolic panel  Result Value Ref Range   Sodium 140 135 - 145 mEq/L   Potassium 4.6 3.5 - 5.3 mEq/L  Chloride 104 96 - 112 mEq/L   CO2 24 19 - 32 mEq/L   Glucose, Bld 83 70 - 99 mg/dL   BUN 10 6 - 23 mg/dL   Creat 4.27 0.62 - 3.76 mg/dL   Total Bilirubin 0.3 0.2 - 1.2 mg/dL   Alkaline Phosphatase 66 39 - 117 U/L   AST 13 0 - 37 U/L   ALT 10 0 - 35 U/L   Total Protein 6.2 6.0 - 8.3 g/dL   Albumin 3.7 3.5 - 5.2 g/dL   Calcium 8.9 8.4 - 28.3 mg/dL  Vit D  25 hydroxy (rtn osteoporosis monitoring)  Result Value Ref Range   Vit D, 25-Hydroxy 25 (L) 30 - 100 ng/mL

## 2014-07-04 NOTE — Patient Instructions (Signed)
Hypothyroidism The thyroid is a large gland located in the lower front of your neck. The thyroid gland helps control metabolism. Metabolism is how your body handles food. It controls metabolism with the hormone thyroxine. When this gland is underactive (hypothyroid), it produces too little hormone.  CAUSES These include:   Absence or destruction of thyroid tissue.  Goiter due to iodine deficiency.  Goiter due to medications.  Congenital defects (since birth).  Problems with the pituitary. This causes a lack of TSH (thyroid stimulating hormone). This hormone tells the thyroid to turn out more hormone. SYMPTOMS  Lethargy (feeling as though you have no energy)  Cold intolerance  Weight gain (in spite of normal food intake)  Dry skin  Coarse hair  Menstrual irregularity (if severe, may lead to infertility)  Slowing of thought processes Cardiac problems are also caused by insufficient amounts of thyroid hormone. Hypothyroidism in the newborn is cretinism, and is an extreme form. It is important that this form be treated adequately and immediately or it will lead rapidly to retarded physical and mental development. DIAGNOSIS  To prove hypothyroidism, your caregiver may do blood tests and ultrasound tests. Sometimes the signs are hidden. It may be necessary for your caregiver to watch this illness with blood tests either before or after diagnosis and treatment. TREATMENT  Low levels of thyroid hormone are increased by using synthetic thyroid hormone. This is a safe, effective treatment. It usually takes about four weeks to gain the full effects of the medication. After you have the full effect of the medication, it will generally take another four weeks for problems to leave. Your caregiver may start you on low doses. If you have had heart problems the dose may be gradually increased. It is generally not an emergency to get rapidly to normal. HOME CARE INSTRUCTIONS   Take your  medications as your caregiver suggests. Let your caregiver know of any medications you are taking or start taking. Your caregiver will help you with dosage schedules.  As your condition improves, your dosage needs may increase. It will be necessary to have continuing blood tests as suggested by your caregiver.  Report all suspected medication side effects to your caregiver. SEEK MEDICAL CARE IF: Seek medical care if you develop:  Sweating.  Tremulousness (tremors).  Anxiety.  Rapid weight loss.  Heat intolerance.  Emotional swings.  Diarrhea.  Weakness. SEEK IMMEDIATE MEDICAL CARE IF:  You develop chest pain, an irregular heart beat (palpitations), or a rapid heart beat. MAKE SURE YOU:   Understand these instructions.  Will watch your condition.  Will get help right away if you are not doing well or get worse. Document Released: 01/08/2005 Document Revised: 04/02/2011 Document Reviewed: 08/29/2007 ExitCare Patient Information 2015 [employer]. This information is not intended to replace advice given to you by your health care provider. Make sure you discuss any questions you have with your health care provider.  

## 2014-07-05 LAB — VITAMIN D 25 HYDROXY (VIT D DEFICIENCY, FRACTURES): VIT D 25 HYDROXY: 25 ng/mL — AB (ref 30–100)

## 2014-07-06 ENCOUNTER — Encounter: Payer: Self-pay | Admitting: Family Medicine

## 2014-07-06 MED ORDER — LEVOTHYROXINE SODIUM 112 MCG PO TABS
112.0000 ug | ORAL_TABLET | Freq: Every day | ORAL | Status: AC
Start: 2014-07-06 — End: ?

## 2014-07-06 MED ORDER — VITAMIN D (ERGOCALCIFEROL) 1.25 MG (50000 UNIT) PO CAPS: 50000.0000 [IU] | ORAL_CAPSULE | ORAL | Status: DC

## 2014-07-14 ENCOUNTER — Telehealth: Payer: Self-pay | Admitting: Obstetrics & Gynecology

## 2014-07-14 NOTE — Telephone Encounter (Signed)
Left patient a message to call back to reschedule a future appointment that was cancelled by the provider. °

## 2014-07-27 ENCOUNTER — Ambulatory Visit: Payer: BC Managed Care – PPO | Admitting: Obstetrics & Gynecology

## 2014-07-28 ENCOUNTER — Ambulatory Visit: Payer: BC Managed Care – PPO | Admitting: Obstetrics & Gynecology

## 2014-08-12 ENCOUNTER — Other Ambulatory Visit: Payer: Self-pay | Admitting: Obstetrics & Gynecology

## 2014-08-13 NOTE — Telephone Encounter (Signed)
Patient notified that rx has been sent to pharmacy  

## 2014-08-13 NOTE — Telephone Encounter (Signed)
Medication refill request: Reclipsen 0.15/30 mcg Last AEX:  07/20/13 with SM Next AEX: 08/17/14 with SM  Last MMG (if hormonal medication request): n/a Refill authorized: #[id number] rfs

## 2014-08-13 NOTE — Telephone Encounter (Signed)
Patient is requesting a refill for her birth control. She is using [employer].

## 2014-08-17 ENCOUNTER — Encounter: Payer: Self-pay | Admitting: Obstetrics & Gynecology

## 2014-08-17 ENCOUNTER — Ambulatory Visit (INDEPENDENT_AMBULATORY_CARE_PROVIDER_SITE_OTHER): Payer: BC Managed Care – PPO | Admitting: Obstetrics & Gynecology

## 2014-08-17 VITALS — BP 132/80 | HR 56 | Resp 20 | Ht <= 58 in | Wt 166.0 lb

## 2014-08-17 DIAGNOSIS — Z01419 Encounter for gynecological examination (general) (routine) without abnormal findings: Secondary | ICD-10-CM | POA: Diagnosis not present

## 2014-08-17 MED ORDER — DESOGESTREL-ETHINYL ESTRADIOL 0.15-30 MG-MCG PO TABS: 1.0000 | ORAL_TABLET | Freq: Every day | ORAL | Status: DC

## 2014-08-17 NOTE — Progress Notes (Signed)
39 y.o. G0P0000 SingleCaucasianF here for annual exam.  Doing well.  Wedding planning for Oct, 2017.  Had labs in the early summer.  On supplemental Vit D.  Has follow up scheduled in the fall.  Cycles are still regular but light.  Lasts 4 days.    PCP:  Norberto Sorenson  Patient's last menstrual period was 08/12/2014.          Sexually active: Yes.    The current method of family planning is OCP (estrogen/progesterone).    Exercising: Yes.    walking Smoker:  no  Health Maintenance: Pap:  07/20/13 ASCUS/negative HR HPV History of abnormal Pap:  no MMG:  none Colonoscopy:  2011-partial c-diff-repeat age 89 or PRN BMD:   07/31/13-stable with osteoporosis TDaP:  Will get with PCP-Dr Clelia Croft Screening Labs: PCP, Hb today: PCP, Urine today: not able to give sample   reports that she has never smoked. She has never used smokeless tobacco. She reports that she does not drink alcohol or use illicit drugs.  Past Medical History  Diagnosis Date  . Turner syndrome   . Hypertension   . Hypothyroidism   . Vitamin D deficiency   . Osteoporosis   . Clostridium difficile colitis 2011    Past Surgical History  Procedure Laterality Date  . Left ear reconstruction x2    . Nasal sinus surgery      Current Outpatient Prescriptions  Medication Sig Dispense Refill  . fluticasone (FLONASE) 50 MCG/ACT nasal spray Place 2 sprays into both nostrils daily. 16 g 12  . levothyroxine (SYNTHROID, LEVOTHROID) 112 MCG tablet Take 1 tablet (112 mcg total) by mouth daily before breakfast. 90 tablet 3  . lisinopril (PRINIVIL,ZESTRIL) 5 MG tablet Take 1 tablet (5 mg total) by mouth daily. 90 tablet 2  . Probiotic Product (PROBIOTIC DAILY PO) Take by mouth.    . RECLIPSEN 0.15-30 MG-MCG tablet TAKE ONE TABLET BY MOUTH ONCE DAILY 3 Package 0  . Vitamin D, Ergocalciferol, (DRISDOL) 50000 UNITS CAPS capsule Take 1 capsule (50,000 Units total) by mouth every 7 (seven) days. 12 capsule 1   No current facility-administered  medications for this visit.    Family History  Problem Relation Age of Onset  . Osteoporosis Mother     ROS:  Pertinent items are noted in HPI.  Otherwise, a comprehensive ROS was negative.  Exam:   BP 132/80 mmHg  Pulse 56  Resp 20  Ht 4' 9.5" (1.461 m)  Wt 166 lb (75.297 kg)  BMI 35.28 kg/m2  LMP 08/12/2014  Weight change: +6#   Height: 4' 9.5" (146.1 cm)  Ht Readings from Last 3 Encounters:  08/17/14 4' 9.5" (1.461 m)  07/04/14 4' 9.5" (1.461 m)  03/23/14 4' 9.5" (1.461 m)    General appearance: alert, cooperative and appears stated age Head: Normocephalic, without obvious abnormality, atraumatic Neck: no adenopathy, supple, symmetrical, trachea midline and thyroid normal to inspection and palpation Lungs: clear to auscultation bilaterally Breasts: normal appearance, no masses or tenderness Heart: regular rate and rhythm Abdomen: soft, non-tender; bowel sounds normal; no masses,  no organomegaly Extremities: extremities normal, atraumatic, no cyanosis or edema Skin: Skin color, texture, turgor normal. No rashes or lesions Lymph nodes: Cervical, supraclavicular, and axillary nodes normal. No abnormal inguinal nodes palpated Neurologic: Grossly normal   Pelvic: External genitalia:  no lesions              Urethra:  normal appearing urethra with no masses, tenderness or lesions  Bartholins and Skenes: normal                 Vagina: normal appearing vagina with normal color and discharge, no lesions              Cervix: no lesions              Pap taken: No. Bimanual Exam:  Uterus:  normal size, contour, position, consistency, mobility, non-tender              Adnexa: normal adnexa and no mass, fullness, tenderness               Rectovaginal: Confirms               Anus:  normal sphincter tone, no lesions  Chaperone was present for exam.  A:  Well Woman with normal exam  Turner's syndrome  Osteoporosis  H/O C. diff colitis in 2011 Hypothyroidism   Low Vit D Hearing loss  On OCPs for HRT/menstrual cycling Sexually active. Using dilators with success. Alopecia  P: Mammogram recommended starting at age 77  Pap smear with neg HR HPV 2013. Pap neg 2015.  No pap today. Cardiologist:  Dr. Allyson Sabal.  Echo 9/15.  Plan to repeat 5 years. Patient knows tetanus is probably due.  Declines today.  We have discussed this in the past as well. Labs with Norberto Sorenson, MD. Follow up scheduled for the fall. Will repeat BMD 3-5 years after last one in 2015. OCP RX to pharmacy. D/W pt switching to HRT around age 70.  return annually or prn

## 2014-12-08 ENCOUNTER — Telehealth: Payer: Self-pay | Admitting: Family Medicine

## 2014-12-08 NOTE — Telephone Encounter (Signed)
Pt called stating that her fiance has passed away, unexpectedly last Saturday, PT requested something (RX) to help her sleep; also advised of Dr. Alver FisherShaw's walk-in availability.  Please contact to advise  [phone]

## 2014-12-10 MED ORDER — LORAZEPAM 1 MG PO TABS: 1.0000 mg | ORAL_TABLET | Freq: Two times a day (BID) | ORAL | Status: DC | PRN

## 2014-12-10 NOTE — Telephone Encounter (Signed)
LVM for pt, called lorazepam into [facility]

## 2014-12-10 NOTE — Telephone Encounter (Signed)
PATIENT STATES DR. SHAW LEFT HER A VOICE MAIL TELLING HER THAT SHE CALLED IN LORAZEPAM TO [facility]. HOWEVER, Rheda SAID DR. SHAW ALSO STILL WANTED TO SPEAK WITH HER. SHE WOULD  LIKE DR. SHAW TO CALL HER BACK. BEST PHONE [phone] (CELL)  MBC

## 2014-12-10 NOTE — Telephone Encounter (Signed)
Called back. Gave pt my cell # in case of future needs during time of grief.

## 2014-12-27 ENCOUNTER — Ambulatory Visit (INDEPENDENT_AMBULATORY_CARE_PROVIDER_SITE_OTHER): Payer: BC Managed Care – PPO | Admitting: Family Medicine

## 2014-12-27 VITALS — BP 144/98 | HR 88 | Temp 98.2°F | Resp 16 | Ht <= 58 in | Wt 162.0 lb

## 2014-12-27 DIAGNOSIS — F4321 Adjustment disorder with depressed mood: Secondary | ICD-10-CM | POA: Diagnosis not present

## 2014-12-27 MED ORDER — SERTRALINE HCL 50 MG PO TABS: 25.0000 mg | ORAL_TABLET | Freq: Every day | ORAL | Status: DC

## 2014-12-27 MED ORDER — CLONAZEPAM 0.5 MG PO TABS: 0.2500 mg | ORAL_TABLET | Freq: Two times a day (BID) | ORAL | Status: DC | PRN

## 2014-12-27 MED ORDER — SERTRALINE HCL 25 MG PO TABS: ORAL_TABLET | ORAL | Status: DC

## 2014-12-27 NOTE — Progress Notes (Addendum)
Subjective:    Patient ID: Mercedes Barry, female    DOB: 11/24/1975, 39 y.o.   MRN: [id number] By signing my name below, I, Javier Dockerobert Ryan Halas, attest that this documentation has been prepared under the direction and in the presence of Norberto SorensonEva Carlous Olivares, MD. Electronically Signed: Javier Dockerobert Ryan Halas, ER Scribe. 12/27/2014. 5:28 PM.  Chief Complaint  Patient presents with  . Follow-up    depression/grieving   . Depression    positive depression screen    HPI HPI Comments: Mercedes Barry is a 39 y.o. female who [facility] for continued depression sx after her fiance passed away suddenly three weeks ago. She was present with him at the time. He walked into the bathroom and she found him unconscious and tried to revive him without success. He was suspected to have an acute coronary event. She states she has continued to cry regularly. Work distracts her to some degree, but she is still very sad. She has been taking lorazepam which has helped her sleep. She has been unable to take it at any other time even when cut in half because it zones her out too much. She has stopped waking up in the middle of the night. Before she started that medication she was waking one hour after she fell asleep, and was unable to go back to sleep.    Past Medical History  Diagnosis Date  . Turner syndrome   . Hypertension   . Hypothyroidism   . Vitamin D deficiency   . Osteoporosis   . Clostridium difficile colitis 2011   Allergies  Allergen Reactions  . Cephalosporins     Patient does not think she is allergic to this-isn't sure why it's on her list.  . Entex Other (See Comments)    "I felt like I was on speed. I thought I was having a heart attack." Tolerates pseudofed.  . Penicillins     Due to reaction to Augmentin, has been advised not to take this.  . Augmentin [Amoxicillin-Pot Clavulanate] Rash   Current Outpatient Prescriptions on File Prior to Visit  Medication Sig Dispense Refill  .  desogestrel-ethinyl estradiol (RECLIPSEN) 0.15-30 MG-MCG tablet Take 1 tablet by mouth daily. 3 Package 4  . fluticasone (FLONASE) 50 MCG/ACT nasal spray Place 2 sprays into both nostrils daily. 16 g 12  . levothyroxine (SYNTHROID, LEVOTHROID) 112 MCG tablet Take 1 tablet (112 mcg total) by mouth daily before breakfast. 90 tablet 3  . lisinopril (PRINIVIL,ZESTRIL) 5 MG tablet Take 1 tablet (5 mg total) by mouth daily. 90 tablet 2  . LORazepam (ATIVAN) 1 MG tablet Take 1 tablet (1 mg total) by mouth 2 (two) times daily as needed for anxiety or sleep. 30 tablet 1  . Probiotic Product (PROBIOTIC DAILY PO) Take by mouth.    . Vitamin D, Ergocalciferol, (DRISDOL) 50000 UNITS CAPS capsule Take 1 capsule (50,000 Units total) by mouth every 7 (seven) days. 12 capsule 1   No current facility-administered medications on file prior to visit.    Review of Systems  Constitutional: Positive for activity change, appetite change and fatigue. Negative for fever and chills.       Pt is teary  Gastrointestinal: Negative for vomiting and abdominal pain.  Allergic/Immunologic: Negative for immunocompromised state.  Neurological: Positive for headaches. Negative for tremors, syncope, speech difficulty and weakness.  Psychiatric/Behavioral: Positive for sleep disturbance, dysphoric mood and decreased concentration. Negative for suicidal ideas, hallucinations, behavioral problems, confusion, self-injury and agitation. The  patient is not nervous/anxious and is not hyperactive.    Depression screen [facility] 2/9 12/27/2014 07/04/2014  Decreased Interest 3 0  Down, Depressed, Hopeless 3 0  PHQ - 2 Score 6 0  Altered sleeping 3 -  Tired, decreased energy 3 -  Change in appetite 3 -  Feeling bad or failure about yourself  0 -  Trouble concentrating 3 -  Moving slowly or fidgety/restless 3 -  Suicidal thoughts 0 -  PHQ-9 Score 21 -  Difficult doing work/chores Extremely dIfficult -        Objective:  BP 144/98 mmHg   Pulse 88  Temp(Src) 98.2 F (36.8 C) (Oral)  Resp 16  Ht  (1.448 m)  Wt 162 lb (73.483 kg)  BMI 35.05 kg/m2  SpO2 98%  LMP 11/26/2014  Physical Exam  Constitutional: She is oriented to person, place, and time. She appears well-developed and well-nourished. No distress.  HENT:  Head: Normocephalic and atraumatic.  Eyes: Pupils are equal, round, and reactive to light.  Neck: Neck supple.  Cardiovascular: Normal rate.   Pulmonary/Chest: Effort normal. No respiratory distress.  Musculoskeletal: Normal range of motion.  Neurological: She is alert and oriented to person, place, and time. Coordination normal.  Skin: Skin is warm and dry. She is not diaphoretic.  Psychiatric: She has a normal mood and affect. Her behavior is normal.  Nursing note and vitals reviewed.     Assessment & Plan:   1. Grief reaction   Pt's fiance passed away sev wks ago.  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  Lorazepam causing to much sedation/menal fogginess to use even 1/2 tab during the day so try klonopin prn during day instead, ok to cont to use prn lorazepam at night. Has contacted hospice for gried counseling but she was told she had to wait until loss was sev mos out.  Responded well to zoloft while in college so retry.  Meds ordered this encounter  Medications  . DISCONTD: sertraline (ZOLOFT) 50 MG tablet    Sig: Take 0.5 tablets (25 mg total) by mouth daily. For 2 weeks and then 1 tab po qd    Dispense:  30 tablet    Refill:  3  . clonazePAM (KLONOPIN) 0.5 MG tablet    Sig: Take 0.5-1 tablets (0.25-0.5 mg total) by mouth 2 (two) times daily as needed for anxiety.    Dispense:  20 tablet    Refill:  1  . sertraline (ZOLOFT) 25 MG tablet    Sig: 1 tab po qd for 2 weeks  and then 2 tabs po qd    Dispense:  60 tablet    Refill:  2   Over 40 min spent in face-to-face evaluation of and consultation with patient and coordination of care.  Over 50% of this time was spent counseling this patient.   I personally performed the services described in this documentation, which was scribed in my presence. The recorded information has been reviewed and considered, and addended by me as needed.  Norberto Sorenson, MD MPH

## 2015-05-02 ENCOUNTER — Other Ambulatory Visit: Payer: Self-pay | Admitting: Family Medicine

## 2015-06-09 ENCOUNTER — Ambulatory Visit: Payer: BC Managed Care – PPO

## 2015-08-16 ENCOUNTER — Encounter (HOSPITAL_COMMUNITY): Payer: Self-pay

## 2015-08-16 ENCOUNTER — Emergency Department (HOSPITAL_COMMUNITY)
Admission: EM | Admit: 2015-08-16 | Discharge: 2015-08-17 | Disposition: A | Discharge status: Home / Self Care | Payer: BC Managed Care – PPO | Attending: Emergency Medicine | Admitting: Emergency Medicine

## 2015-08-16 ENCOUNTER — Emergency Department (HOSPITAL_COMMUNITY): Payer: BC Managed Care – PPO

## 2015-08-16 ENCOUNTER — Other Ambulatory Visit: Payer: Self-pay | Admitting: Gastroenterology

## 2015-08-16 DIAGNOSIS — R197 Diarrhea, unspecified: Secondary | ICD-10-CM | POA: Diagnosis not present

## 2015-08-16 DIAGNOSIS — R1031 Right lower quadrant pain: Secondary | ICD-10-CM | POA: Diagnosis present

## 2015-08-16 DIAGNOSIS — E039 Hypothyroidism, unspecified: Secondary | ICD-10-CM | POA: Insufficient documentation

## 2015-08-16 DIAGNOSIS — R110 Nausea: Secondary | ICD-10-CM | POA: Insufficient documentation

## 2015-08-16 DIAGNOSIS — Z79899 Other long term (current) drug therapy: Secondary | ICD-10-CM | POA: Diagnosis not present

## 2015-08-16 DIAGNOSIS — R1013 Epigastric pain: Secondary | ICD-10-CM

## 2015-08-16 DIAGNOSIS — R109 Unspecified abdominal pain: Secondary | ICD-10-CM

## 2015-08-16 DIAGNOSIS — I10 Essential (primary) hypertension: Secondary | ICD-10-CM | POA: Diagnosis not present

## 2015-08-16 LAB — CBC
HCT: 40.7 % (ref 36.0–46.0)
HEMOGLOBIN: 13.4 g/dL (ref 12.0–15.0)
MCH: 29.4 pg (ref 26.0–34.0)
MCHC: 32.9 g/dL (ref 30.0–36.0)
MCV: 89.3 fL (ref 78.0–100.0)
PLATELETS: 342 10*3/uL (ref 150–400)
RBC: 4.56 MIL/uL (ref 3.87–5.11)
RDW: 13.2 % (ref 11.5–15.5)
WBC: 12.6 10*3/uL — ABNORMAL HIGH (ref 4.0–10.5)

## 2015-08-16 LAB — URINALYSIS, ROUTINE W REFLEX MICROSCOPIC
BILIRUBIN URINE: NEGATIVE
Glucose, UA: NEGATIVE mg/dL
HGB URINE DIPSTICK: NEGATIVE
Ketones, ur: NEGATIVE mg/dL
Leukocytes, UA: NEGATIVE
NITRITE: NEGATIVE
PROTEIN: NEGATIVE mg/dL
Specific Gravity, Urine: >1.046 — ABNORMAL HIGH (ref 1.005–1.030)
pH: 5.5 (ref 5.0–8.0)

## 2015-08-16 LAB — COMPREHENSIVE METABOLIC PANEL
ALK PHOS: 84 U/L (ref 38–126)
ALT: 18 U/L (ref 14–54)
ANION GAP: 11 (ref 5–15)
AST: 21 U/L (ref 15–41)
Albumin: 4 g/dL (ref 3.5–5.0)
BILIRUBIN TOTAL: 0.3 mg/dL (ref 0.3–1.2)
BUN: 8 mg/dL (ref 6–20)
CALCIUM: 8.9 mg/dL (ref 8.9–10.3)
CO2: 24 mmol/L (ref 22–32)
CREATININE: 0.66 mg/dL (ref 0.44–1.00)
Chloride: 104 mmol/L (ref 101–111)
Glucose, Bld: 100 mg/dL — ABNORMAL HIGH (ref 65–99)
Potassium: 3.9 mmol/L (ref 3.5–5.1)
SODIUM: 139 mmol/L (ref 135–145)
TOTAL PROTEIN: 7.3 g/dL (ref 6.5–8.1)

## 2015-08-16 LAB — WET PREP, GENITAL
Sperm: NONE SEEN
TRICH WET PREP: NONE SEEN
Yeast Wet Prep HPF POC: NONE SEEN

## 2015-08-16 LAB — I-STAT BETA HCG BLOOD, ED (MC, WL, AP ONLY)

## 2015-08-16 LAB — LIPASE, BLOOD: Lipase: 12 U/L (ref 11–51)

## 2015-08-16 MED ORDER — IOPAMIDOL (ISOVUE-300) INJECTION 61%
100.0000 mL | Freq: Once | INTRAVENOUS | Status: AC | PRN
  Administered 2015-08-16: 100 mL via INTRAVENOUS

## 2015-08-16 MED ORDER — ONDANSETRON HCL 4 MG/2ML IJ SOLN
4.0000 mg | Freq: Once | INTRAMUSCULAR | Status: AC
  Administered 2015-08-16: 4 mg via INTRAVENOUS
  Filled 2015-08-16: qty 2

## 2015-08-16 MED ORDER — DIATRIZOATE MEGLUMINE & SODIUM 66-10 % PO SOLN
15.0000 mL | Freq: Once | ORAL | Status: AC
  Administered 2015-08-16: 15 mL via ORAL

## 2015-08-16 MED ORDER — MORPHINE SULFATE (PF) 4 MG/ML IV SOLN
4.0000 mg | Freq: Once | INTRAVENOUS | Status: AC
  Administered 2015-08-16: 4 mg via INTRAVENOUS
  Filled 2015-08-16: qty 1

## 2015-08-16 MED ORDER — ONDANSETRON HCL 4 MG/2ML IJ SOLN
4.0000 mg | Freq: Once | INTRAMUSCULAR | Status: AC
  Administered 2015-08-17: 4 mg via INTRAVENOUS
  Filled 2015-08-16: qty 2

## 2015-08-16 MED ORDER — SODIUM CHLORIDE 0.9 % IV BOLUS (SEPSIS)
1000.0000 mL | Freq: Once | INTRAVENOUS | Status: AC
  Administered 2015-08-16: 1000 mL via INTRAVENOUS

## 2015-08-16 NOTE — ED Triage Notes (Signed)
Pt here with abdominal pain since yesterday.  Notified MD and told they thought it was gallbladder.  Pt told she cannot get ultrasound until august.  Told to come here.  Pain is constant.  No fever.  Nausea with no vomiting. Diarrhea.  No vaginal discharge or change in urination.

## 2015-08-16 NOTE — ED Provider Notes (Signed)
[facility]-EMERGENCY DEPT Provider Note   CSN: [id number] Arrival date & time: 08/16/15  1730  First Provider Contact:  First MD Initiated Contact with Patient 08/16/15 1932        History   Chief Complaint Chief Complaint  Patient presents with  . Abdominal Pain    HPI Mercedes Barry is a 40 y.o. female.  Patient is a 40 year old female with past medical history of Turner syndrome, hypertension and C. difficile who presents the ED with complaint of abdominal pain. Patient reports having intermittent diffuse aching abdominal pain for the past few months but notes her abdominal pain has worsened and become constant over the past week. Endorses associated intermittent nonbloody diarrhea and nausea. She notes she was seen by GI, Dr. Loreta Ave, last week regarding her abdominal pain and states she had a negative GI panel and negative C. Difficile. Patient notes she saw Dr. Loreta Ave this afternoon due to worsening abdominal pain and states they plan on having an outpatient ultrasound performed for evaluation of her gallbladder. Denies fever, chills, cough, shortness of breath, chest pain, vomiting, urinary symptoms, vaginal bleeding, vaginal discharge. Patient denies taking any medications prior to arrival. Last menstrual cycle 08/11/15. Denies history of abdominal surgeries.      Past Medical History:  Diagnosis Date  . Clostridium difficile colitis 2011  . Hypertension   . Hypothyroidism   . Osteoporosis   . Turner syndrome   . Vitamin D deficiency     Patient Active Problem List   Diagnosis Date Noted  . Seasonal allergies 03/23/2014  . Vitamin D deficiency 02/02/2014  . Thyroid activity decreased 02/02/2014  . Essential hypertension 09/25/2013  . Turner's syndrome 09/25/2013    Past Surgical History:  Procedure Laterality Date  . left ear reconstruction x2    . NASAL SINUS SURGERY      OB History    Gravida Para Term Preterm AB Living   0 0 0 0 0 0   SAB TAB Ectopic Multiple Live  Births   0 0 0 0         Home Medications    Prior to Admission medications   Medication Sig Start Date End Date Taking? Authorizing Provider  calcium-vitamin D (OSCAL WITH D) 500-200 MG-UNIT tablet Take 2 tablets by mouth at bedtime.   Yes Historical Provider, MD  desogestrel-ethinyl estradiol (RECLIPSEN) 0.15-30 MG-MCG tablet Take 1 tablet by mouth daily. 08/17/14  Yes Jerene Bears, MD  levothyroxine (SYNTHROID, LEVOTHROID) 112 MCG tablet Take 1 tablet (112 mcg total) by mouth daily before breakfast. 07/06/14  Yes Sherren Mocha, MD  lisinopril (PRINIVIL,ZESTRIL) 5 MG tablet TAKE 1 TABLET BY MOUTH DAILY 05/02/15  Yes Sherren Mocha, MD  Nutritional Supplements (IBS SUPPORT PO) Take 1 tablet by mouth every 12 (twelve) hours. *IBgard*   Yes Historical Provider, MD  PRESCRIPTION MEDICATION Take 1 tablet by mouth every 12 (twelve) hours. Antibiotic samples from dr's office started 07/25 for 7 days   Yes Historical Provider, MD  Probiotic Product (RESTORA) CAPS Take 1 capsule by mouth daily.   Yes Historical Provider, MD  sertraline (ZOLOFT) 25 MG tablet 1 tab po qd for 2 weeks and then 2 tabs po qd Patient taking differently: Take 75 mg by mouth daily.  12/27/14  Yes Sherren Mocha, MD  spironolactone (ALDACTONE) 100 MG tablet Take 100 mg by mouth daily. 07/11/15  Yes Historical Provider, MD  ciprofloxacin (CIPRO) 500 MG tablet Take 1 tablet (500 mg total) by mouth  every 12 (twelve) hours. 08/17/15   Barrett Henle, PA-C  clonazePAM (KLONOPIN) 0.5 MG tablet Take 0.5-1 tablets (0.25-0.5 mg total) by mouth 2 (two) times daily as needed for anxiety. Patient not taking: Reported on 08/16/2015 12/27/14   Sherren Mocha, MD  fluticasone [facility]) 50 MCG/ACT nasal spray Place 2 sprays into both nostrils daily. Patient not taking: Reported on 08/16/2015 09/28/13   Chelle Jeffery, PA-C  LORazepam (ATIVAN) 1 MG tablet Take 1 tablet (1 mg total) by mouth 2 (two) times daily as needed for anxiety or sleep. Patient not  taking: Reported on 08/16/2015 12/10/14   Sherren Mocha, MD  metroNIDAZOLE (FLAGYL) 500 MG tablet Take 1 tablet (500 mg total) by mouth 2 (two) times daily. 08/17/15   Barrett Henle, PA-C  ondansetron (ZOFRAN ODT) 4 MG disintegrating tablet Take 1 tablet (4 mg total) by mouth every 8 (eight) hours as needed for nausea or vomiting. 08/17/15   Barrett Henle, PA-C  oxyCODONE-acetaminophen (PERCOCET/ROXICET) 5-325 MG tablet Take 1 tablet by mouth every 4 (four) hours as needed for severe pain. 08/17/15   Barrett Henle, PA-C  Vitamin D, Ergocalciferol, (DRISDOL) 50000 UNITS CAPS capsule Take 1 capsule (50,000 Units total) by mouth every 7 (seven) days. Patient not taking: Reported on 08/16/2015 07/06/14   Sherren Mocha, MD    Family History Family History  Problem Relation Age of Onset  . Osteoporosis Mother     Social History Social History  Substance Use Topics  . Smoking status: Never Smoker  . Smokeless tobacco: Never Used  . Alcohol use No     Allergies   Entex; Penicillins; Augmentin [amoxicillin-pot clavulanate]; and Cephalosporins   Review of Systems Review of Systems  Gastrointestinal: Positive for abdominal pain, diarrhea and nausea.  All other systems reviewed and are negative.    Physical Exam Updated Vital Signs BP 121/90   Pulse 100   Temp 98.3 F (36.8 C) (Oral)   Resp 16   LMP 08/10/2015   SpO2 96%   Physical Exam  Constitutional: She is oriented to person, place, and time. She appears well-developed and well-nourished.  HENT:  Head: Normocephalic and atraumatic.  Mouth/Throat: Oropharynx is clear and moist. No oropharyngeal exudate.  Eyes: Conjunctivae and EOM are normal. Right eye exhibits no discharge. Left eye exhibits no discharge. No scleral icterus.  Neck: Normal range of motion. Neck supple.  Cardiovascular: Regular rhythm, normal heart sounds and intact distal pulses.   Tachycardic, HR 108  Pulmonary/Chest: Effort normal and  breath sounds normal. No respiratory distress. She has no wheezes. She has no rales. She exhibits no tenderness.  Abdominal: Soft. Bowel sounds are normal. She exhibits no distension and no mass. There is tenderness (mild diffuse tenderness, worse in periumbilica, suprapubic and RLQ). There is no rigidity, no rebound, no guarding, no tenderness at McBurney's point and negative Murphy's sign. No hernia.  No CVA tenderness  Musculoskeletal: She exhibits no edema.  Neurological: She is alert and oriented to person, place, and time.  Skin: Skin is warm and dry.  Nursing note and vitals reviewed.  Pelvic exam: normal external genitalia, vulva, vagina, cervix, uterus and adnexa, VULVA: normal appearing vulva with no masses, tenderness or lesions, VAGINA: normal appearing vagina with normal color and discharge, no lesions, CERVIX: normal appearing cervix without discharge or lesions, WET MOUNT done - results: clue cells, white blood cells, DNA probe for chlamydia and GC obtained, UTERUS: uterus is normal size, shape, consistency and nontender, ADNEXA:  normal adnexa in size, nontender and no masses, exam chaperoned by female tech.   ED Treatments / Results  Labs (all labs ordered are listed, but only abnormal results are displayed) Labs Reviewed  WET PREP, GENITAL - Abnormal; Notable for the following:       Result Value   Clue Cells Wet Prep HPF POC PRESENT (*)    WBC, Wet Prep HPF POC MANY (*)    All other components within normal limits  COMPREHENSIVE METABOLIC PANEL - Abnormal; Notable for the following:    Glucose, Bld 100 (*)    All other components within normal limits  CBC - Abnormal; Notable for the following:    WBC 12.6 (*)    All other components within normal limits  URINALYSIS, ROUTINE W REFLEX MICROSCOPIC (NOT AT [facility]) - Abnormal; Notable for the following:    Specific Gravity, Urine >1.046 (*)    All other components within normal limits  LIPASE, BLOOD  I-STAT BETA HCG BLOOD, ED  ([facility], [facility], [facility] ONLY)  GC/CHLAMYDIA PROBE AMP ([place]) NOT AT [facility]    EKG  EKG Interpretation None       Radiology Ct Abdomen Pelvis W Contrast  Result Date: 08/16/2015 CLINICAL DATA:  40 year old female with lower abdominal pain and diarrhea. In EXAM: CT ABDOMEN AND PELVIS WITH CONTRAST TECHNIQUE: Multidetector CT imaging of the abdomen and pelvis was performed using the standard protocol following bolus administration of intravenous contrast. CONTRAST:  ISOVUE-300 IOPAMIDOL (ISOVUE-300) INJECTION 61% COMPARISON:  CT dated 02/05/2009 FINDINGS: The visualized lung bases are clear. No intra-abdominal free air or free fluid. There is a 3.1 x 2.1 cm (previously 1.5 x 1.8 cm) hypodense lesion with apparent nodular peripheral enhancement most likely a hemangioma. This lesion however is not characterized on this single phase contrast study. MRI is recommended for further characterization. The liver is otherwise unremarkable. The gallbladder, pancreas, spleen, adrenal glands, kidneys, visualized ureters, and urinary bladder appear unremarkable with the uterus is anteverted and grossly unremarkable. The ovaries are not well visualized. No pelvic mass identified. There is submucosal fat deposit in the walls of the distal small bowel, terminal ileum, and the entire colon likely sequela of chronic inflammation. Correlation with history of inflammatory bowel disease such as Crohn's recommended. There is minimal haziness of the fat surrounding the distal ileum which may represent mild enteritis or flare up of the inflammatory bowel disease. Is no evidence of bowel obstruction. Normal appendix. The abdominal aorta and IVC appear unremarkable. No portal venous gas identified. There is no adenopathy. The abdominal wall soft tissues appear unremarkable. Mild degenerative changes of the spine. No acute osseous pathology. IMPRESSION: Submucosal fat deposit in the distal and terminal ileum as well colon Sequela of  chronic inflammation. Correlation with history of inflammatory bowel disease recommended. Mild superimposed active inflammation or flare up may be present. Clinical correlation is recommended. No bowel obstruction. Normal appendix. Electronically Signed   By: Elgie Collard M.D.   On: 08/16/2015 22:25   Procedures Procedures (including critical care time)  Medications Ordered in ED Medications  sodium chloride 0.9 % bolus 1,000 mL (0 mLs Intravenous Stopped 08/16/15 2140)  morphine 4 MG/ML injection 4 mg (4 mg Intravenous Given 08/16/15 2012)  ondansetron (ZOFRAN) injection 4 mg (4 mg Intravenous Given 08/16/15 2018)  iopamidol (ISOVUE-300) 61 % injection 100 mL (100 mLs Intravenous Contrast Given 08/16/15 2200)  diatrizoate meglumine-sodium (GASTROGRAFIN) 66-10 % solution 15 mL (15 mLs Oral Given 08/16/15 2159)  ondansetron (ZOFRAN) injection  4 mg (4 mg Intravenous Given 08/17/15 0002)     Initial Impression / Assessment and Plan / ED Course  I have reviewed the triage vital signs and the nursing notes.  Pertinent labs & imaging results that were available during my care of the patient were reviewed by me and considered in my medical decision making (see chart for details).  Clinical Course    Patient presents with abdominal pain with associated nausea and diarrhea. History of Turner syndrome and C. difficile. Patient denies any recent antibiotic use. Afebrile, HR 112, normotensive. Exam revealed mild diffuse abdominal tenderness, worse in periumbilical, suprapubic and right lower quadrant, no peritoneal signs, no CVA tenderness. Patient given IV fluids, Zofran and pain meds. WBC 12.6. Wet prep revealed clue cells. Pregnancy negative. Remaining labs and urine unremarkable. CT abdomen revealed chronic inflammation of distal and terminal ileum, normal appendix. Consulted GI. Dr. Loreta Ave advised start patient on Cipro and Flagyl and discharge patient home with pain meds. Advised that she will  schedule patient for colonoscopy this week for further evaluation of IBD. Discussed results with patient. On reevaluation patient reports her pain has improved. Patient able to tolerate PO in the ED. Plan to discharge patient home with pain meds, antibiotics, symptomatic treatment and outpatient follow-up. Discussed return precautions with patient.  Final Clinical Impressions(s) / ED Diagnoses   Final diagnoses:  Abdominal pain, unspecified abdominal location  Diarrhea, unspecified type    New Prescriptions Discharge Medication List as of 08/17/2015 12:23 AM    START taking these medications   Details  ciprofloxacin (CIPRO) 500 MG tablet Take 1 tablet (500 mg total) by mouth every 12 (twelve) hours., Starting Wed 08/17/2015, Print    metroNIDAZOLE (FLAGYL) 500 MG tablet Take 1 tablet (500 mg total) by mouth 2 (two) times daily., Starting Wed 08/17/2015, Print    ondansetron (ZOFRAN ODT) 4 MG disintegrating tablet Take 1 tablet (4 mg total) by mouth every 8 (eight) hours as needed for nausea or vomiting., Starting Wed 08/17/2015, Print    oxyCODONE-acetaminophen (PERCOCET/ROXICET) 5-325 MG tablet Take 1 tablet by mouth every 4 (four) hours as needed for severe pain., Starting Wed 08/17/2015, Print         Satira Sark [place], [place] 08/17/15 0123    Leta Baptist, MD 08/17/15 1501

## 2015-08-16 NOTE — ED Notes (Signed)
Pt tried to give a urine sample wasn't able to provide one

## 2015-08-16 NOTE — ED Notes (Signed)
PA at bedside.

## 2015-08-16 NOTE — ED Notes (Signed)
Pt in CT.

## 2015-08-16 NOTE — ED Notes (Signed)
Patient made aware urine sample is needed. Patient states she is unable to void at this time.

## 2015-08-17 LAB — GC/CHLAMYDIA PROBE AMP (~~LOC~~) NOT AT ARMC
Chlamydia: NEGATIVE
NEISSERIA GONORRHEA: NEGATIVE

## 2015-08-17 MED ORDER — CIPROFLOXACIN HCL 500 MG PO TABS: 500.0000 mg | ORAL_TABLET | Freq: Two times a day (BID) | ORAL | 0 refills | Status: DC

## 2015-08-17 MED ORDER — ONDANSETRON 4 MG PO TBDP: 4.0000 mg | ORAL_TABLET | Freq: Three times a day (TID) | ORAL | 0 refills | Status: DC | PRN

## 2015-08-17 MED ORDER — OXYCODONE-ACETAMINOPHEN 5-325 MG PO TABS: 1.0000 | ORAL_TABLET | ORAL | 0 refills | Status: DC | PRN

## 2015-08-17 MED ORDER — METRONIDAZOLE 500 MG PO TABS: 500.0000 mg | ORAL_TABLET | Freq: Two times a day (BID) | ORAL | 0 refills | Status: DC

## 2015-08-17 NOTE — Discharge Instructions (Signed)
Take all of your medications as prescribed. Continue drinking fluids at home to remain hydrated. I recommended call ED or GI doctor, Dr. Loreta Ave, tomorrow morning to schedule a follow-up appointment to have your colonoscopy scheduled for this week. Please return to the Emergency Department if symptoms worsen or new onset of fever, worsening abdominal pain, vomiting, blood in urine or stool, unable to keep fluids down.

## 2015-08-22 ENCOUNTER — Ambulatory Visit: Payer: BC Managed Care – PPO | Admitting: Obstetrics & Gynecology

## 2015-08-22 LAB — HM COLONOSCOPY

## 2015-08-30 ENCOUNTER — Encounter: Payer: Self-pay | Admitting: Obstetrics & Gynecology

## 2015-08-30 ENCOUNTER — Ambulatory Visit (INDEPENDENT_AMBULATORY_CARE_PROVIDER_SITE_OTHER): Payer: BC Managed Care – PPO | Admitting: Obstetrics & Gynecology

## 2015-08-30 VITALS — BP 118/74 | HR 76 | Resp 14 | Ht <= 58 in | Wt 162.2 lb

## 2015-08-30 DIAGNOSIS — Z124 Encounter for screening for malignant neoplasm of cervix: Secondary | ICD-10-CM | POA: Diagnosis not present

## 2015-08-30 DIAGNOSIS — Z01419 Encounter for gynecological examination (general) (routine) without abnormal findings: Secondary | ICD-10-CM

## 2015-08-30 MED ORDER — DESOGESTREL-ETHINYL ESTRADIOL 0.15-30 MG-MCG PO TABS: 1.0000 | ORAL_TABLET | Freq: Every day | ORAL | 4 refills | Status: DC

## 2015-08-30 NOTE — Progress Notes (Signed)
40 y.o. G0P0000 Single Caucasian F here for annual exam.  Partner died of heart failure while in the bathroom.  They were living together and she found him.  She called 911 and did CPR until EMS arrived.  They set a wedding date for October.  She is dreading that date.  She is doing grief counseling with Hospice which has been very good for her.  Pt went to the ER on July 25th.  Reports she had just been seen by Dr. Loreta AveMann.  Was having so much pain that she felt she couldn't wait to do this on an outpatient basis.  Treated with antibiotics.  Had colonoscopy and endoscopy 7/31.    PCP:  Nadyne CoombesKaren Richter.    Patient's last menstrual period was 08/10/2015.          Sexually active: No.  The current method of family planning is OCP (estrogen/progesterone).    Exercising: Yes.    walking Smoker:  no  Health Maintenance: Pap:  07/20/2013 ASCUS, negative HR HPV  History of abnormal Pap:  no MMG:  none Colonoscopy:  08/22/2015 polyp per patient  BMD:   07/31/2013 stable osteoporosis, repeat 2-3 years TDaP:  Outdated, declined today will follow up with PCP Pneumonia vaccine(s):  never Zostavax:   never Hep C testing: not indicated Screening Labs: PCP, Hb today: PCP, Urine today: PCP   reports that she has never smoked. She has never used smokeless tobacco. She reports that she does not drink alcohol or use drugs.  Past Medical History:  Diagnosis Date  . Clostridium difficile colitis 2011  . Hypertension   . Hypothyroidism   . Osteoporosis   . Turner syndrome   . Vitamin D deficiency     Past Surgical History:  Procedure Laterality Date  . left ear reconstruction x2    . NASAL SINUS SURGERY      Current Outpatient Prescriptions  Medication Sig Dispense Refill  . calcium-vitamin D (OSCAL WITH D) 500-200 MG-UNIT tablet Take 2 tablets by mouth at bedtime.    . ciprofloxacin (CIPRO) 500 MG tablet Take 1 tablet (500 mg total) by mouth every 12 (twelve) hours. 20 tablet 0  . clonazePAM  (KLONOPIN) 0.5 MG tablet Take 0.5-1 tablets (0.25-0.5 mg total) by mouth 2 (two) times daily as needed for anxiety. (Patient not taking: Reported on 08/16/2015) 20 tablet 1  . desogestrel-ethinyl estradiol (RECLIPSEN) 0.15-30 MG-MCG tablet Take 1 tablet by mouth daily. 3 Package 4  . fluticasone (FLONASE) 50 MCG/ACT nasal spray Place 2 sprays into both nostrils daily. (Patient not taking: Reported on 08/16/2015) 16 g 12  . levothyroxine (SYNTHROID, LEVOTHROID) 112 MCG tablet Take 1 tablet (112 mcg total) by mouth daily before breakfast. 90 tablet 3  . lisinopril (PRINIVIL,ZESTRIL) 5 MG tablet TAKE 1 TABLET BY MOUTH DAILY 90 tablet 0  . LORazepam (ATIVAN) 1 MG tablet Take 1 tablet (1 mg total) by mouth 2 (two) times daily as needed for anxiety or sleep. (Patient not taking: Reported on 08/16/2015) 30 tablet 1  . metroNIDAZOLE (FLAGYL) 500 MG tablet Take 1 tablet (500 mg total) by mouth 2 (two) times daily. 14 tablet 0  . Nutritional Supplements (IBS SUPPORT PO) Take 1 tablet by mouth every 12 (twelve) hours. *IBgard*    . ondansetron (ZOFRAN ODT) 4 MG disintegrating tablet Take 1 tablet (4 mg total) by mouth every 8 (eight) hours as needed for nausea or vomiting. 10 tablet 0  . oxyCODONE-acetaminophen (PERCOCET/ROXICET) 5-325 MG tablet Take 1 tablet by  mouth every 4 (four) hours as needed for severe pain. 15 tablet 0  . PRESCRIPTION MEDICATION Take 1 tablet by mouth every 12 (twelve) hours. Antibiotic samples from dr's office started 07/25 for 7 days    . Probiotic Product (RESTORA) CAPS Take 1 capsule by mouth daily.    . sertraline (ZOLOFT) 25 MG tablet 1 tab po qd for 2 weeks and then 2 tabs po qd (Patient taking differently: Take 75 mg by mouth daily. ) 60 tablet 2  . spironolactone (ALDACTONE) 100 MG tablet Take 100 mg by mouth daily.    . Vitamin D, Ergocalciferol, (DRISDOL) 50000 UNITS CAPS capsule Take 1 capsule (50,000 Units total) by mouth every 7 (seven) days. (Patient not taking: Reported on  08/16/2015) 12 capsule 1   No current facility-administered medications for this visit.     Family History  Problem Relation Age of Onset  . Osteoporosis Mother     ROS:  Pertinent items are noted in HPI.  Otherwise, a comprehensive ROS was negative.  Exam:   Vitals:   08/30/15 0902  BP: 118/74  Pulse: 76  Resp: 14  Weight: 162 lb 3.2 oz (73.6 kg)  Height: 4' 9.5" (1.461 m)    General appearance: alert, cooperative and appears stated age Head: Normocephalic, without obvious abnormality, atraumatic Neck: no adenopathy, supple, symmetrical, trachea midline and thyroid normal to inspection and palpation Lungs: clear to auscultation bilaterally Breasts: normal appearance, no masses or tenderness Heart: regular rate and rhythm Abdomen: soft, non-tender; bowel sounds normal; no masses,  no organomegaly Extremities: extremities normal, atraumatic, no cyanosis or edema Skin: Skin color, texture, turgor normal. No rashes or lesions Lymph nodes: Cervical, supraclavicular, and axillary nodes normal. No abnormal inguinal nodes palpated Neurologic: Grossly normal   Pelvic: External genitalia:  no lesions              Urethra:  normal appearing urethra with no masses, tenderness or lesions              Bartholins and Skenes: normal                 Vagina: normal appearing vagina with normal color and discharge, no lesions              Cervix: no lesions              Pap taken: Yes.   Bimanual Exam:  Uterus:  normal size, contour, position, consistency, mobility, non-tender              Adnexa: normal adnexa and no mass, fullness, tenderness               Rectovaginal: Confirms               Anus:  normal sphincter tone, no lesions  Chaperone was present for exam.  A:  Well Woman with normal exam  Turner's syndrome  Osteoporosis  H/O C. diff colitis in 2011.  Colonoscopy 7/17 due to abdominal pain Hypothyroidism  Vit D deficiency Hearing loss  On OCPs for HRT/menstrual  cycling Grief reaction due to loss of fiancee  Alopecia  P:   Screening MMG recommendations discussed Pap smear with neg HR HPV 2013. Pap neg 2015.  Pap and HR HPV today Cardiologist:  Dr. Allyson Sabal.  Echo 9/15.  Plan to repeat 5 years. Pt declines tdap today. Has screening labs with Dr. Hal Hope. Will repeat BMD next year.  Pt declines repeating this year. Had discussed possible change to  HRT or lower dosed OCPs this year.  Due to grief of the last year, will plan to reconsider next year.  Rx for Reclipsen to pharmacy.  #3 month supply/4RF. return annually or prn

## 2015-09-01 ENCOUNTER — Encounter: Payer: BC Managed Care – PPO | Attending: Family Medicine | Admitting: Dietician

## 2015-09-01 DIAGNOSIS — E039 Hypothyroidism, unspecified: Secondary | ICD-10-CM | POA: Insufficient documentation

## 2015-09-01 DIAGNOSIS — I10 Essential (primary) hypertension: Secondary | ICD-10-CM | POA: Insufficient documentation

## 2015-09-01 DIAGNOSIS — E781 Pure hyperglyceridemia: Secondary | ICD-10-CM | POA: Insufficient documentation

## 2015-09-01 DIAGNOSIS — E7800 Pure hypercholesterolemia, unspecified: Secondary | ICD-10-CM | POA: Insufficient documentation

## 2015-09-01 DIAGNOSIS — Z713 Dietary counseling and surveillance: Secondary | ICD-10-CM | POA: Diagnosis not present

## 2015-09-01 DIAGNOSIS — E669 Obesity, unspecified: Secondary | ICD-10-CM | POA: Diagnosis not present

## 2015-09-01 LAB — IPS PAP TEST WITH HPV

## 2015-09-01 NOTE — Patient Instructions (Addendum)
Plan to bring water with fruit for school (limit sugary drinks). When weather gets cooler have herbal teas. Plan to go out to eat 2 x week (Tuesday and Friday/Saturday). Aim to fill half of your plate with vegetable. Have protein on a quarter of your plate and starch on the other quarter. Have protein with carbs for snacks if you hungry.  Choose one starch to have at meals (especially restaurant). Plan to go to they gym 2-3 x week. Right after work.

## 2015-09-01 NOTE — Progress Notes (Signed)
  Medical Nutrition Therapy:  Appt start time: 0755 end time:  0840.   Assessment:  Primary concerns today: Mercedes Barry is here today since she needs to lose weight. Fiance passed away about 8 months ago. Started eating a lot of "crap" since she was coping with this. Has gained about 10 lbs since this and weight had been on the higher side for a while. Fiance was overweight and she had started dating him. Has not made any changes recently.   Has IBS. Would also like to work on overall eating healthy. Feels like she might want to eat less dairy and gluten to see if she feels better, but she loves those foods.   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 Lives by herself and she does her own food preparation. Does not miss or skip meals. Eats out 3-5 meals per week.   Loves starch especially potatoes. Does not eat sweets. Splenda can cause some stomach discomfort.  Portion sizes are about average. And hunger level is average though zoloft makes her hungry somewhat.   Weight loss goal: 105-110 lbs. Last weighed this in around 2013.  Preferred Learning Style:   No preference indicated   Learning Readiness:   Ready   MEDICATIONS: see list   DIETARY INTAKE:  Usual eating pattern includes 3 meals and 1 snacks per day.  Aoided foods include: slimy foods     24-hr recall:  B ( AM): cereal (cheerios/banana nut crunch with almond milk) or boiled egg with fruit or multigrain toast with peanut butter Snk ( AM): none  L ( PM): varies a lot  - leftover pasta, meat and potatoes Snk ( PM): chips or peanuts  D ( PM): goes out to eat - Italian/Mexican/Cookout  Snk ( PM): none Beverages: coffee with flavored creamer (not during summer), soda, lemonade at lunch, flavored water, sweet tea  Usual physical activity: walking 7 x week for 10-15 minutes  Estimated energy needs: 1600 calories 180 g carbohydrates 120 g protein 44 g fat  Progress Towards Goal(s):  In  progress.   Nutritional Diagnosis:  Oden-3.3 Overweight/obesity As related to hx of frequent restaurant meals and sugar sweetened beverages.  As evidenced by BMI of 34.3.    Intervention:  Nutrition counseling provided. Plan: Plan to bring water with fruit for school (limit sugary drinks). When weather gets cooler have herbal teas. Plan to go out to eat 2 x week (Tuesday and Friday/Saturday). Aim to fill half of your plate with vegetable. Have protein on a quarter of your plate and starch on the other quarter. Have protein with carbs for snacks if you hungry.  Choose one starch to have at meals (especially restaurant). Plan to go to they gym 2-3 x week. Right after work.   Teaching Method Utilized:  Visual Auditory Hands on  Handouts given during visit include:  MyPlate  Meal Card  15 g CHO Snacks  Barriers to learning/adherence to lifestyle change: none  Demonstrated degree of understanding via:  Teach Back   Monitoring/Evaluation:  Dietary intake, exercise, and body weight in 1 month(s).

## 2015-09-08 ENCOUNTER — Ambulatory Visit (HOSPITAL_COMMUNITY): Payer: BC Managed Care – PPO

## 2015-09-08 ENCOUNTER — Encounter: Payer: Self-pay | Admitting: Obstetrics & Gynecology

## 2015-09-08 ENCOUNTER — Telehealth: Payer: Self-pay | Admitting: *Deleted

## 2015-09-08 NOTE — Telephone Encounter (Signed)
My Chart message and response to patient:  Hi Mercedes Barry,  We will check and see if we can locate these results and get them to Dr Mercedes Barry. If we have not received them, we will contact you back and let you know.  Thank you,  Billie RuddySally Ailey Wessling, RN Clinical Supervisor  ===View-only below this line===   ----- Message -----    From: Hyman BibleLeah M. Epperly    Sent: 09/08/2015 12:01 AM EDT      To: Annamaria BootsMILLER, MARY SUZANNE, MD Subject: Visit Follow-Up Question   Hi Dr. Hyacinth Barry!   I know you were trying to get all my records faxed over from Dr. Aniceto Bossitcher and the ER.  I know you were concerned about my elevated calcium. Were you able to get all the records you needed and what do you recommend about the elevated calcium?

## 2015-09-09 ENCOUNTER — Encounter: Payer: Self-pay | Admitting: Dietician

## 2015-09-09 ENCOUNTER — Telehealth: Payer: Self-pay

## 2015-09-09 NOTE — Progress Notes (Unsigned)
Phone message sent to medical records

## 2015-09-30 NOTE — Telephone Encounter (Signed)
Per Mervin KungStarla at front desk, records were received and in route to scan center. Update was provided to patient.  Routing to Dr Hyacinth MeekerMiller for review/instructions.

## 2015-10-03 ENCOUNTER — Ambulatory Visit: Payer: BC Managed Care – PPO | Admitting: Dietician

## 2015-10-03 ENCOUNTER — Other Ambulatory Visit: Payer: Self-pay | Admitting: Obstetrics & Gynecology

## 2015-10-11 NOTE — Telephone Encounter (Signed)
We need to have a release on file to send patients information to another providers office,  unless we do the referral.

## 2015-10-11 NOTE — Telephone Encounter (Signed)
Call to patient. Left message to call back.Follow up on labs from Dr Hal Hopeichter.

## 2015-10-12 NOTE — Telephone Encounter (Signed)
These results are not in EPIC.  Can the release be refaxed please?

## 2015-10-12 NOTE — Telephone Encounter (Signed)
Greta has [facility] Roysoriginal medical record release information and will contact Dr Wendee Coppichter's office.

## 2015-10-13 ENCOUNTER — Telehealth: Payer: Self-pay | Admitting: Obstetrics & Gynecology

## 2015-10-13 NOTE — Telephone Encounter (Signed)
[facility]  States message was left requesting a return call, but no other information was given.  Informed patient phone call could have been regarding labs from Dr. Wendee Coppichter's office.  Review of chart with input from patient regarding date of labs shows labs dated 07/18/15 with Calcium level are now available in her chart.  Advised patient I will let Dr. Hyacinth MeekerMiller know these labs are available and we will follow up with the patient once these have been reviewed.  Patient is agreeable and is glad she could help by verifying dates and practice name of ordering physician.  Please advise further recommendations.

## 2015-10-13 NOTE — Telephone Encounter (Signed)
Patient says that she is [facility]. No open telephone note?   IO:[id number]:triage

## 2015-10-18 NOTE — Telephone Encounter (Signed)
Please call pt and let her know that her lab work in June showed elevated serum calcium at 12.3.  However, when she was in the hospital in July, there was another level done and it was normal.  My recommendation would just be to repeat it and make sure it is still normal.  If that is the case, nothing else is needed.  I have not placed any lab orders.

## 2015-10-20 NOTE — Telephone Encounter (Signed)
I would close encounter.  Pt is aware of recommendations and if she calls for blood work, can just be added to the lab schedule.  Thanks.

## 2015-10-20 NOTE — Telephone Encounter (Signed)
Spoke with patient. Advised as seen below per Dr. Hyacinth MeekerMiller. Patient states she may have lab drawn at PCP-  K. Rich[facility]. Patient states she will call back today and let office know if she plans to have labs drawn at PCP or schedule here.    Dr. Hyacinth MeekerMiller, will leave encounter open to follow-up.

## 2015-10-20 NOTE — Telephone Encounter (Signed)
See telephone encounter dated 10/11/15.   Routing to provider for final review. Patient is agreeable to disposition. Will close encounter.

## 2015-11-25 ENCOUNTER — Ambulatory Visit: Payer: BC Managed Care – PPO | Admitting: Obstetrics & Gynecology

## 2015-12-13 ENCOUNTER — Other Ambulatory Visit: Payer: Self-pay

## 2016-08-23 DIAGNOSIS — L658 Other specified nonscarring hair loss: Secondary | ICD-10-CM

## 2016-08-23 HISTORY — DX: Other specified nonscarring hair loss: L65.8

## 2016-08-30 ENCOUNTER — Ambulatory Visit: Payer: BC Managed Care – PPO | Admitting: Obstetrics & Gynecology

## 2016-09-10 ENCOUNTER — Other Ambulatory Visit: Payer: Self-pay | Admitting: Obstetrics & Gynecology

## 2016-09-10 NOTE — Telephone Encounter (Signed)
Medication refill request: Apri  Last AEX:  08-30-15  Next AEX: 11-19-16  Last MMG (if hormonal medication request): none on file Refill authorized: please advise

## 2016-10-18 ENCOUNTER — Encounter: Payer: Self-pay | Admitting: Physician Assistant

## 2016-10-18 ENCOUNTER — Ambulatory Visit (INDEPENDENT_AMBULATORY_CARE_PROVIDER_SITE_OTHER): Payer: BC Managed Care – PPO | Admitting: Physician Assistant

## 2016-10-18 VITALS — BP 120/77 | HR 99 | Temp 98.5°F | Resp 18 | Ht <= 58 in | Wt 164.2 lb

## 2016-10-18 DIAGNOSIS — J0100 Acute maxillary sinusitis, unspecified: Secondary | ICD-10-CM

## 2016-10-18 DIAGNOSIS — J029 Acute pharyngitis, unspecified: Secondary | ICD-10-CM

## 2016-10-18 LAB — POCT RAPID STREP A (OFFICE): Rapid Strep A Screen: NEGATIVE

## 2016-10-18 MED ORDER — DOXYCYCLINE HYCLATE 100 MG PO CAPS: 100.0000 mg | ORAL_CAPSULE | Freq: Two times a day (BID) | ORAL | 0 refills | Status: DC

## 2016-10-18 NOTE — Progress Notes (Signed)
MRN: [id number] DOB: 10/31/1975  Subjective:   Mercedes Barry is a 41 y.o. female presenting for chief complaint of Facial Pain; Sore Throat; and Fatigue .  Reports 5 day history of sinus headache, sinus congestion, sinus pain, ear fullness, sore throat, post nasal drip, shortness of breath, mild dry cough, chills and fatigue. Has tried mucinex-dm daily with no relief. Denies fever, itchy watery eyes, ear pain, chest pain and myalgia, nausea, vomiting, abdominal pain and diarrhea. She is most concerned about her sinus pressure. 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 Has history of seasonal allergies but has not taken flonase recently. No history of asthma. Patient has not had flu shot this season. Denies smoking.`  Has hx of sinus infections. Had nasal endoscopy cleaning in 12/2015. Was informed afterwards that she could continue nasal spray. Does nasal saline rinse daily.   Shamica has a current medication list which includes the following prescription(s): apri, calcium-vitamin d, levothyroxine, nutritional supplements, restora, sertraline, spironolactone, clonazepam, doxycycline, fluticasone, lisinopril, lorazepam, and vitamin d (ergocalciferol). Also is allergic to entex; penicillins; augmentin [amoxicillin-pot clavulanate]; and cephalosporins.  Aasiya  has a past medical history of Clostridium difficile colitis (2011); Hypertension; Hypothyroidism; Osteoporosis; Turner syndrome; and Vitamin D deficiency. Also  has a past surgical history that includes left ear reconstruction x2 and Nasal sinus surgery.   Objective:   Vitals: BP 120/77 (BP Location: Left Arm, Patient Position: Sitting, Cuff Size: Normal)   Pulse 99   Temp 98.5 F (36.9 C) (Oral)   Resp 18   Ht 4' 9.87" (1.47 m)   Wt 164 lb 3.2 oz (74.5 kg)   LMP 10/15/2016 (Approximate)   SpO2 98%   BMI 34.47 kg/m   Physical Exam  Constitutional: She is oriented to person, place, and time. She appears well-developed  and well-nourished. She appears distressed (appears uncomfortable sitting on exam table).  HENT:  Head: Normocephalic and atraumatic.  Right Ear: Tympanic membrane, external ear and ear canal normal.  Left Ear: Ear canal normal. Tympanic membrane is scarred. Tympanic membrane is not erythematous and not bulging.  Nose: Mucosal edema (moderate bilaterally) present. No rhinorrhea. Right sinus exhibits maxillary sinus tenderness. Right sinus exhibits no frontal sinus tenderness. Left sinus exhibits maxillary sinus tenderness. Left sinus exhibits no frontal sinus tenderness.  Mouth/Throat: Uvula is midline and mucous membranes are normal. Posterior oropharyngeal erythema (with green mucous noted ) present. Tonsils are 1+ on the right. Tonsils are 1+ on the left. No tonsillar exudate.  Eyes: Pupils are equal, round, and reactive to light. Conjunctivae and EOM are normal.  Neck: Normal range of motion.  Cardiovascular: Normal rate, regular rhythm and normal heart sounds.   Pulmonary/Chest: Effort normal. She has no decreased breath sounds. She has no wheezes. She has no rhonchi. She has no rales.  Lymphadenopathy:       Head (right side): No submental, no submandibular, no tonsillar, no preauricular, no posterior auricular and no occipital adenopathy present.       Head (left side): No submental, no submandibular, no tonsillar, no preauricular, no posterior auricular and no occipital adenopathy present.    She has no cervical adenopathy.       Right: No supraclavicular adenopathy present.       Left: No supraclavicular adenopathy present.  Neurological: She is alert and oriented to person, place, and time.  Skin: Skin is warm and dry.  Psychiatric: She has a normal mood and affect.  Vitals reviewed.    Results  for orders placed or performed in visit on 10/18/16 (from the past 24 hour(s))  POCT rapid strep A     Status: None   Collection Time: 10/18/16 12:32 PM  Result Value Ref Range   Rapid  Strep A Screen Negative Negative    Assessment and Plan :  1. Sore throat - POCT rapid strep A 2. Acute maxillary sinusitis, recurrence not specified Due to hx and duration of symptoms, will treat empirically for underlying bacterial etiology. Pt encouraged to return to clinic if symptoms worsen, do not improve in 7-10 dyas, or as needed - doxycycline (VIBRAMYCIN) 100 MG capsule; Take 1 capsule (100 mg total) by mouth 2 (two) times daily.  Dispense: 20 capsule; Refill: 0  Benjiman Core, PA-C  Primary Care at [facility] 10/18/2016 7:11 PM

## 2016-10-18 NOTE — Patient Instructions (Addendum)
We are going to treat your sinus infection with doxycycline. While taking Doxycycline:  -Do not drink milk or take iron supplements, multivitamins, calcium supplements, antacids, laxatives within 2 hours before or after taking doxycycline. -Avoid direct exposure to sunlight or tanning beds. Doxycycline can make you sunburn more easily. Wear protective clothing and use sunscreen (SPF 30 or higher) when you are outdoors. -Antibiotic medicines can cause diarrhea, which may be a sign of a new infection. If you have diarrhea that is watery or bloody, stop taking this medicine and seek medical care.   Return to clinic if symptoms worsen, do not improve 7-10 days, or as needed    IF you received an x-ray today, you will receive an invoice from [facility]. Please contact [facility] at [phone] with questions or concerns regarding your invoice.   IF you received labwork today, you will receive an invoice from [facility]. Please contact [facility] at [phone] with questions or concerns regarding your invoice.   Our billing staff will not be able to assist you with questions regarding bills from these companies.  You will be contacted with the lab results as soon as they are available. The fastest way to get your results is to activate your My Chart account. Instructions are located on the last page of this paperwork. If you have not heard from [place] regarding the results in 2 weeks, please contact this office.    si Sinusitis, Adult Sinusitis is soreness and inflammation of your sinuses. Sinuses are hollow spaces in the bones around your face. They are located:  Around your eyes.  In the middle of your forehead.  Behind your nose.  In your cheekbones.  Your sinuses and nasal passages are lined with a stringy fluid (mucus). Mucus normally drains out of your sinuses. When your nasal tissues get inflamed or swollen, the mucus can get trapped or blocked so air cannot flow through  your sinuses. This lets bacteria, viruses, and funguses grow, and that leads to infection. Follow these instructions at home: Medicines  Take, use, or apply over-the-counter and prescription medicines only as told by your doctor. These may include nasal sprays.  If you were prescribed an antibiotic medicine, take it as told by your doctor. Do not stop taking the antibiotic even if you start to feel better. Hydrate and Humidify  Drink enough water to keep your pee (urine) clear or pale yellow.  Use a cool mist humidifier to keep the humidity level in your home above 50%.  Breathe in steam for 10-15 minutes, 3-4 times a day or as told by your doctor. You can do this in the bathroom while a hot shower is running.  Try not to spend time in cool or dry air. Rest  Rest as much as possible.  Sleep with your head raised (elevated).  Make sure to get enough sleep each night. General instructions  Put a warm, moist washcloth on your face 3-4 times a day or as told by your doctor. This will help with discomfort.  Wash your hands often with soap and water. If there is no soap and water, use hand sanitizer.  Do not smoke. Avoid being around people who are smoking (secondhand smoke).  Keep all follow-up visits as told by your doctor. This is important. Contact a doctor if:  You have a fever.  Your symptoms get worse.  Your symptoms do not get better within 10 days. Get help right away if:  You have a very bad headache.  You cannot stop throwing up (vomiting).  You have pain or swelling around your face or eyes.  You have trouble seeing.  You feel confused.  Your neck is stiff.  You have trouble breathing. This information is not intended to replace advice given to you by your health care provider. Make sure you discuss any questions you have with your health care provider. Document Released: 06/27/2007 Document Revised: 09/04/2015 Document Reviewed: 11/03/2014 Elsevier  Interactive Patient Education  [employer].

## 2016-10-29 ENCOUNTER — Ambulatory Visit (INDEPENDENT_AMBULATORY_CARE_PROVIDER_SITE_OTHER): Payer: BC Managed Care – PPO | Admitting: Family Medicine

## 2016-10-29 ENCOUNTER — Encounter: Payer: Self-pay | Admitting: Family Medicine

## 2016-10-29 VITALS — BP 126/88 | HR 111 | Temp 98.6°F | Resp 16 | Ht 59.0 in | Wt 161.8 lb

## 2016-10-29 DIAGNOSIS — J0111 Acute recurrent frontal sinusitis: Secondary | ICD-10-CM

## 2016-10-29 MED ORDER — PREDNISONE 20 MG PO TABS: ORAL_TABLET | ORAL | 0 refills | Status: DC

## 2016-10-29 MED ORDER — HYDROCOD POLST-CPM POLST ER 10-8 MG/5ML PO SUER: 5.0000 mL | Freq: Two times a day (BID) | ORAL | 0 refills | Status: DC | PRN

## 2016-10-29 MED ORDER — AZITHROMYCIN 250 MG PO TABS: ORAL_TABLET | ORAL | 0 refills | Status: DC

## 2016-10-29 NOTE — Progress Notes (Signed)
Subjective:    Patient ID: Mercedes Barry, female    DOB: 28-May-1975, 41 y.o.   MRN: [id number] Chief Complaint  Patient presents with  . Cough    PRODUCTIVE with green mucus x 2 weeks  . sinus pressure    HPI Was seen 9/27, treated with doxy. Students telling her to go back. Deep hoarse cough. Chills, no fevers.  pain in frontal sinus and this morning had pain upon awakening. Tired, drained, no energy. Not much nasal drainage but +PND and cough.  Cough non-productive. +DOE. Sleeping well but coughing waking her up for sleep.  Went bed at 7:30 pm last night. Taking mucinex. Improved on doxy but never back to nml then worsened again as soon as the course was complete.  Feels clammy, diaphorectic, but never has a fever. This morning after the shower she had to go lay down again as felt presyncopal. Poor appetite but drinking plenty of fluids.  Got c. Diff after levaquin.   Past Medical History:  Diagnosis Date  . Clostridium difficile colitis 2011  . Hypertension   . Hypothyroidism   . Osteoporosis   . Turner syndrome   . Vitamin D deficiency    Past Surgical History:  Procedure Laterality Date  . left ear reconstruction x2    . NASAL SINUS SURGERY     Current Outpatient Prescriptions on File Prior to Visit  Medication Sig Dispense Refill  . APRI 0.15-30 MG-MCG tablet TAKE 1 TABLET BY MOUTH DAILY 84 tablet 0  . fluticasone (FLONASE) 50 MCG/ACT nasal spray Place 2 sprays into both nostrils daily. 16 g 12  . levothyroxine (SYNTHROID, LEVOTHROID) 112 MCG tablet Take 1 tablet (112 mcg total) by mouth daily before breakfast. 90 tablet 3  . spironolactone (ALDACTONE) 100 MG tablet Take 100 mg by mouth daily.    . calcium-vitamin D (OSCAL WITH D) 500-200 MG-UNIT tablet Take 2 tablets by mouth at bedtime.    Marland Kitchen lisinopril (PRINIVIL,ZESTRIL) 5 MG tablet TAKE 1 TABLET BY MOUTH DAILY (Patient not taking: Reported on 08/30/2015) 90 tablet 0  . Nutritional Supplements (IBS SUPPORT PO)  Take 1 tablet by mouth every 12 (twelve) hours. *IBgard*    . Probiotic Product (RESTORA) CAPS Take 1 capsule by mouth daily.     No current facility-administered medications on file prior to visit.    Allergies  Allergen Reactions  . Entex Shortness Of Breath and Other (See Comments)    "I felt like I was on speed. I thought I was having a heart attack." Tolerates pseudofed.  . Penicillins     Due to reaction to Augmentin, has been advised not to take this. Has patient had a PCN reaction causing immediate rash, facial/tongue/throat swelling, SOB or lightheadedness with hypotension: Unknown Has patient had a PCN reaction causing severe rash involving mucus membranes or skin necrosis: Unknown Has patient had a PCN reaction that required hospitalization: Unknown Has patient had a PCN reaction occurring within the last 10 years: No  If all of the above answers are "NO", then may proceed with  . Augmentin [Amoxicillin-Pot Clavulanate] Rash    Has patient had a PCN reaction causing immediate rash, facial/tongue/throat swelling, SOB or lightheadedness with hypotension: No Has patient had a PCN reaction causing severe rash involving mucus membranes or skin necrosis: No Has patient had a PCN reaction that required hospitalization No Has patient had a PCN reaction occurring within the last 10 years: No If all of the above answers are "NO", then  may proceed with Cephalosporin use.   . Cephalosporins Rash   Family History  Problem Relation Age of Onset  . Osteoporosis Mother    Social History   Social History  . Marital status: Single    Spouse name: engaged-Robby  . Number of children: 0  . Years of education: N/A   Occupational History  . teacher [employer]   Social History Main Topics  . Smoking status: Never Smoker  . Smokeless tobacco: Never Used  . Alcohol use No  . Drug use: No  . Sexual activity: No   Other Topics Concern  . None   Social History Narrative     Lives with her fiance, Robby. They plan to marry in 10/2015. Her parents live in [place], [place].   Depression screen [facility] 2/9 10/29/2016 10/18/2016 09/01/2015 12/27/2014 07/04/2014  Decreased Interest 0 0 0 3 0  Down, Depressed, Hopeless 0 0 0 3 0  PHQ - 2 Score 0 0 0 6 0  Altered sleeping - - - 3 -  Tired, decreased energy - - - 3 -  Change in appetite - - - 3 -  Feeling bad or failure about yourself  - - - 0 -  Trouble concentrating - - - 3 -  Moving slowly or fidgety/restless - - - 3 -  Suicidal thoughts - - - 0 -  PHQ-9 Score - - - 21 -  Difficult doing work/chores - - - Extremely dIfficult -     Review of Systems  Constitutional: Positive for activity change, appetite change, chills, diaphoresis, fatigue and fever.  HENT: Positive for congestion, ear pain, postnasal drip, sinus pressure, sore throat and voice change. Negative for ear discharge, nosebleeds, rhinorrhea, sneezing and trouble swallowing.   Eyes: Negative for discharge and itching.  Respiratory: Positive for cough. Negative for shortness of breath.   Cardiovascular: Negative for chest pain.  Gastrointestinal: Negative for abdominal pain, nausea and vomiting.  Musculoskeletal: Negative for neck pain and neck stiffness.  Skin: Negative for rash.  Neurological: Positive for headaches. Negative for dizziness and syncope.  Hematological: Positive for adenopathy.  Psychiatric/Behavioral: Positive for sleep disturbance.       Objective:   Physical Exam  Constitutional: She is oriented to person, place, and time. She appears well-developed and well-nourished. She appears lethargic. She appears ill. No distress.  HENT:  Head: Normocephalic and atraumatic.  Right Ear: External ear and ear canal normal. Tympanic membrane is retracted. A middle ear effusion is present.  Left Ear: External ear and ear canal normal. Tympanic membrane is retracted. A middle ear effusion is present.  Nose: Mucosal edema and rhinorrhea present.  Right sinus exhibits maxillary sinus tenderness. Left sinus exhibits maxillary sinus tenderness.  Mouth/Throat: Uvula is midline and mucous membranes are normal. Posterior oropharyngeal erythema present. No oropharyngeal exudate, posterior oropharyngeal edema or tonsillar abscesses.  Eyes: Conjunctivae are normal. Right eye exhibits no discharge. Left eye exhibits no discharge. No scleral icterus.  Neck: Normal range of motion. Neck supple.  Cardiovascular: Normal rate, regular rhythm, normal heart sounds and intact distal pulses.   Pulmonary/Chest: Effort normal and breath sounds normal.  Lymphadenopathy:       Head (right side): Submandibular adenopathy present. No preauricular and no posterior auricular adenopathy present.       Head (left side): Submandibular adenopathy present. No preauricular and no posterior auricular adenopathy present.    She has no cervical adenopathy.       Right: No supraclavicular adenopathy present.  Left: No supraclavicular adenopathy present.  Neurological: She is oriented to person, place, and time. She appears lethargic.  Skin: Skin is warm and dry. She is not diaphoretic. No erythema.  Psychiatric: She has a normal mood and affect. Her behavior is normal.      BP 126/88 (BP Location: Left Arm, Patient Position: Sitting, Cuff Size: Normal)   Pulse (!) 111   Temp 98.6 F (37 C) (Oral)   Resp 16   Ht  (1.499 m)   Wt 161 lb 12.8 oz (73.4 kg)   LMP 10/15/2016 (Approximate)   SpO2 97%   BMI 32.68 kg/m   Assessment & Plan:   1. Acute recurrent frontal sinusitis   If no improvement, in sev d, call and will change to course of levaquin (pt reluctant to do as did get c. Diff requiring hosp after taking course of levaquin prev).   Meds ordered this encounter  Medications  . azithromycin (ZITHROMAX) 250 MG tablet    Sig: Take 2 tabs PO x 1 dose, then 1 tab PO QD x 4 days    Dispense:  6 tablet    Refill:  0  . predniSONE (DELTASONE) 20 MG  tablet    Sig: Take 3 tabs qd x 3d, then 2 tabs qd x 3d then 1 tab qd x 3d.    Dispense:  18 tablet    Refill:  0  . chlorpheniramine-HYDROcodone (TUSSIONEX PENNKINETIC ER) 10-8 MG/5ML SUER    Sig: Take 5 mLs by mouth every 12 (twelve) hours as needed.    Dispense:  120 mL    Refill:  0    Norberto Sorenson, M.D.  Primary Care at [facility] [address] [place], [place] [address] [phone] phone [phone] fax  10/30/16 10:49 PM

## 2016-10-29 NOTE — Patient Instructions (Addendum)
   IF you received an x-ray today, you will receive an invoice from [facility]. Please contact [facility] at [phone] with questions or concerns regarding your invoice.   IF you received labwork today, you will receive an invoice from [facility]. Please contact [facility] at [phone] with questions or concerns regarding your invoice.   Our billing staff will not be able to assist you with questions regarding bills from these companies.  You will be contacted with the lab results as soon as they are available. The fastest way to get your results is to activate your My Chart account. Instructions are located on the last page of this paperwork. If you have not heard from us regarding the results in 2 weeks, please contact this office.      Sinusitis, Adult Sinusitis is soreness and inflammation of your sinuses. Sinuses are hollow spaces in the bones around your face. Your sinuses are located:  Around your eyes.  In the middle of your forehead.  Behind your nose.  In your cheekbones. Your sinuses and nasal passages are lined with a stringy fluid (mucus). Mucus normally drains out of your sinuses. When your nasal tissues become inflamed or swollen, the mucus can become trapped or blocked so air cannot flow through your sinuses. This allows bacteria, viruses, and funguses to grow, which leads to infection. Sinusitis can develop quickly and last for 7?10 days (acute) or for more than 12 weeks (chronic). Sinusitis often develops after a cold. What are the causes? This condition is caused by anything that creates swelling in the sinuses or stops mucus from draining, including:  Allergies.  Asthma.  Bacterial or viral infection.  Abnormally shaped bones between the nasal passages.  Nasal growths that contain mucus (nasal polyps).  Narrow sinus openings.  Pollutants, such as chemicals or irritants in the air.  A foreign object stuck in the nose.  A fungal  infection. This is rare. What increases the risk? The following factors may make you more likely to develop this condition:  Having allergies or asthma.  Having had a recent cold or respiratory tract infection.  Having structural deformities or blockages in your nose or sinuses.  Having a weak immune system.  Doing a lot of swimming or diving.  Overusing nasal sprays.  Smoking. What are the signs or symptoms? The main symptoms of this condition are pain and a feeling of pressure around the affected sinuses. Other symptoms include:  Upper toothache.  Earache.  Headache.  Bad breath.  Decreased sense of smell and taste.  A cough that may get worse at night.  Fatigue.  Fever.  Thick drainage from your nose. The drainage is often green and it may contain pus (purulent).  Stuffy nose or congestion.  Postnasal drip. This is when extra mucus collects in the throat or back of the nose.  Swelling and warmth over the affected sinuses.  Sore throat.  Sensitivity to light. How is this diagnosed? This condition is diagnosed based on symptoms, a medical history, and a physical exam. To find out if your condition is acute or chronic, your health care provider may:  Look in your nose for signs of nasal polyps.  Tap over the affected sinus to check for signs of infection.  View the inside of your sinuses using an imaging device that has a light attached (endoscope). If your health care provider suspects that you have chronic sinusitis, you may also:  Be tested for allergies.  Have a sample of   mucus taken from your nose (nasal culture) and checked for bacteria.  Have a mucus sample examined to see if your sinusitis is related to an allergy. If your sinusitis does not respond to treatment and it lasts longer than 8 weeks, you may have an MRI or CT scan to check your sinuses. These scans also help to determine how severe your infection is. In rare cases, a bone biopsy may  be done to rule out more serious types of fungal sinus disease. How is this treated? Treatment for sinusitis depends on the cause and whether your condition is chronic or acute. If a virus is causing your sinusitis, your symptoms will go away on their own within 10 days. You may be given medicines to relieve your symptoms, including:  Topical nasal decongestants. They shrink swollen nasal passages and let mucus drain from your sinuses.  Antihistamines. These drugs block inflammation that is triggered by allergies. This can help to ease swelling in your nose and sinuses.  Topical nasal corticosteroids. These are nasal sprays that ease inflammation and swelling in your nose and sinuses.  Nasal saline washes. These rinses can help to get rid of thick mucus in your nose. If your condition is caused by bacteria, you will be given an antibiotic medicine. If your condition is caused by a fungus, you will be given an antifungal medicine. Surgery may be needed to correct underlying conditions, such as narrow nasal passages. Surgery may also be needed to remove polyps. Follow these instructions at home: Medicines   Take, use, or apply over-the-counter and prescription medicines only as told by your health care provider. These may include nasal sprays.  If you were prescribed an antibiotic medicine, take it as told by your health care provider. Do not stop taking the antibiotic even if you start to feel better. Hydrate and Humidify   Drink enough water to keep your urine clear or pale yellow. Staying hydrated will help to thin your mucus.  Use a cool mist humidifier to keep the humidity level in your home above 50%.  Inhale steam for 10-15 minutes, 3-4 times a day or as told by your health care provider. You can do this in the bathroom while a hot shower is running.  Limit your exposure to cool or dry air. Rest   Rest as much as possible.  Sleep with your head raised (elevated).  Make sure to  get enough sleep each night. General instructions   Apply a warm, moist washcloth to your face 3-4 times a day or as told by your health care provider. This will help with discomfort.  Wash your hands often with soap and water to reduce your exposure to viruses and other germs. If soap and water are not available, use hand sanitizer.  Do not smoke. Avoid being around people who are smoking (secondhand smoke).  Keep all follow-up visits as told by your health care provider. This is important. Contact a health care provider if:  You have a fever.  Your symptoms get worse.  Your symptoms do not improve within 10 days. Get help right away if:  You have a severe headache.  You have persistent vomiting.  You have pain or swelling around your face or eyes.  You have vision problems.  You develop confusion.  Your neck is stiff.  You have trouble breathing. This information is not intended to replace advice given to you by your health care provider. Make sure you discuss any questions you have with   your health care provider. Document Released: 01/08/2005 Document Revised: 09/04/2015 Document Reviewed: 11/03/2014 Elsevier Interactive Patient Education  2017 Elsevier Inc.  

## 2016-11-19 ENCOUNTER — Ambulatory Visit: Payer: BC Managed Care – PPO | Admitting: Obstetrics & Gynecology

## 2016-11-24 ENCOUNTER — Ambulatory Visit: Payer: BC Managed Care – PPO | Admitting: Family Medicine

## 2016-12-08 ENCOUNTER — Other Ambulatory Visit: Payer: Self-pay | Admitting: Obstetrics & Gynecology

## 2016-12-10 NOTE — Telephone Encounter (Signed)
Refill done.  Is there anyway to move her appt to a sooner date?  Thanks.

## 2016-12-10 NOTE — Telephone Encounter (Signed)
Medication refill request: Reclipsen Last AEX:  08/30/15 SM Next AEX: 02/25/17 Last MMG (if hormonal medication request): none Refill authorized: 09/10/16 #84 w/0 refills; today please advise

## 2017-02-25 ENCOUNTER — Ambulatory Visit: Payer: BC Managed Care – PPO | Admitting: Obstetrics & Gynecology

## 2017-02-28 ENCOUNTER — Other Ambulatory Visit: Payer: Self-pay | Admitting: Obstetrics & Gynecology

## 2017-02-28 NOTE — Telephone Encounter (Signed)
Medication refill request: OCP  Last AEX:  08-30-15 Next AEX: 04-22-17  Last MMG (if hormonal medication request): none on file Refill authorized: please advise

## 2017-04-19 ENCOUNTER — Other Ambulatory Visit: Payer: Self-pay | Admitting: Obstetrics & Gynecology

## 2017-04-19 ENCOUNTER — Ambulatory Visit
Admission: RE | Admit: 2017-04-19 | Discharge: 2017-04-19 | Disposition: A | Discharge status: Home / Self Care | Payer: BC Managed Care – PPO | Source: Ambulatory Visit | Attending: Obstetrics & Gynecology | Admitting: Obstetrics & Gynecology

## 2017-04-19 DIAGNOSIS — Z1231 Encounter for screening mammogram for malignant neoplasm of breast: Secondary | ICD-10-CM

## 2017-04-22 ENCOUNTER — Encounter: Payer: Self-pay | Admitting: Obstetrics & Gynecology

## 2017-04-22 ENCOUNTER — Other Ambulatory Visit: Payer: Self-pay

## 2017-04-22 ENCOUNTER — Ambulatory Visit: Payer: BC Managed Care – PPO | Admitting: Obstetrics & Gynecology

## 2017-04-22 VITALS — BP 130/86 | HR 94 | Resp 14 | Ht <= 58 in | Wt 163.0 lb

## 2017-04-22 DIAGNOSIS — Z01419 Encounter for gynecological examination (general) (routine) without abnormal findings: Secondary | ICD-10-CM | POA: Diagnosis not present

## 2017-04-22 DIAGNOSIS — N898 Other specified noninflammatory disorders of vagina: Secondary | ICD-10-CM | POA: Diagnosis not present

## 2017-04-22 MED ORDER — DESOGESTREL-ETHINYL ESTRADIOL 0.15-30 MG-MCG PO TABS: 1.0000 | ORAL_TABLET | Freq: Every day | ORAL | 4 refills | Status: DC

## 2017-04-22 NOTE — Progress Notes (Addendum)
42 y.o. G0P0000 SingleCaucasianF here for annual exam.  Having issues with pansinusitis.  Being followed by Dr. Janace Hoard.  Had sinus surgery 11/2015.  Recent 02/27/17 done due to recurrent issues.  Seeing allergist.  Started allergy shots but these were twice weekly and it was just too many visits.  States she's going to return as she's still having issues.  Has done allergy testing and is allergic to dust mites.  Denies vaginal bleeding.  Takes continuous active OCPs.    Wearing her engagement ring.  Fiance died August 18, 2015.  Doing better from grief standpoint.     PCP:  Dr. Darron Doom.  Saw her last fall.    Patient's last menstrual period was 03/29/2017.          Sexually active: No.  The current method of family planning is OCP (estrogen/progesterone) and abstinence.    Exercising: Yes.    walking Smoker:  no  Health Maintenance: Pap:  08/30/15 Neg. HR HPV:neg    07/20/13 ASCUS. HR HPV:Neg  History of abnormal Pap:  no MMG:  04/19/17 BIRADS1:neg  Colonoscopy:  08/22/15 polyps.  Repeat recommended but pt unsure about when.   BMD:   07/31/13 Osteoporosis  TDaP:  Unsure.  Guidelines reviewed.  Declines today. Pneumonia vaccine(s):  n/a Shingrix:   n/a Hep C testing: n/a Screening Labs: PCP   reports that she has never smoked. She has never used smokeless tobacco. She reports that she does not drink alcohol or use drugs.  Past Medical History:  Diagnosis Date  . Clostridium difficile colitis 2011  . Hypertension   . Hypothyroidism   . Osteoporosis   . Turner syndrome   . Vitamin D deficiency     Past Surgical History:  Procedure Laterality Date  . left ear reconstruction x2    . NASAL SINUS SURGERY      Current Outpatient Medications  Medication Sig Dispense Refill  . calcium-vitamin D (OSCAL WITH D) 500-200 MG-UNIT tablet Take 2 tablets by mouth at bedtime.    . ISIBLOOM 0.15-30 MG-MCG tablet TAKE 1 TABLET BY MOUTH DAILY 84 tablet 0  . levothyroxine (SYNTHROID, LEVOTHROID) 112  MCG tablet Take 1 tablet (112 mcg total) by mouth daily before breakfast. 90 tablet 3  . lisinopril (PRINIVIL,ZESTRIL) 10 MG tablet Take 1 tablet by mouth daily.  0   No current facility-administered medications for this visit.     Family History  Problem Relation Age of Onset  . Osteoporosis Mother   . BRCA 1/2 Maternal Grandmother 11    Review of Systems  All other systems reviewed and are negative.   Exam:   BP 130/86 (BP Location: Right Arm, Patient Position: Sitting, Cuff Size: Normal)   Pulse 94   Resp 14   Ht 4' 9.25" (1.454 m)   Wt 163 lb (73.9 kg)   LMP 03/29/2017   BMI 34.97 kg/m    Height: 4' 9.25" (145.4 cm)  Ht Readings from Last 3 Encounters:  04/22/17 4' 9.25" (1.454 m)  10/29/16 4' 11"  (1.499 m)  10/18/16 4' 9.87" (1.47 m)    General appearance: alert, cooperative and appears stated age Head: Normocephalic, without obvious abnormality, atraumatic Neck: no adenopathy, supple, symmetrical, trachea midline and thyroid normal to inspection and palpation Lungs: clear to auscultation bilaterally Breasts: normal appearance, no masses or tenderness Heart: regular rate and rhythm Abdomen: soft, non-tender; bowel sounds normal; no masses,  no organomegaly Extremities: extremities normal, atraumatic, no cyanosis or edema Skin: Skin color, texture, turgor  normal. No rashes or lesions Lymph nodes: Cervical, supraclavicular, and axillary nodes normal. No abnormal inguinal nodes palpated Neurologic: Grossly normal   Pelvic: External genitalia:  no lesions              Urethra:  normal appearing urethra with no masses, tenderness or lesions              Bartholins and Skenes: normal                 Vagina: normal appearing vagina with normal color and discharge, no lesions              Cervix: no lesions              Pap taken: No. Bimanual Exam:  Uterus:  normal size, contour, position, consistency, mobility, non-tender              Adnexa: normal adnexa and no  mass, fullness, tenderness               Rectovaginal: Confirms               Anus:  normal sphincter tone, no lesions  Chaperone was present for exam.  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 Diff colitis 2011.  Colonoscopy done 7/17. Hypothyroidism Hearing loss On OCPs Alopecia  P:   Mammogram guidelines reviewed pap smear and HR HPV neg/neg 8/17 Declines Tdap again today.   Will plan BMD and schedule for pt.  Pt will do this summer.  Pt will do blood work with Dr. Jim Like pending RF for OCPs.  #3 month/4RF.  Will plan to transition to HRT in the future but doing well currently, so will wait. Return annually or prn

## 2017-04-23 ENCOUNTER — Encounter: Payer: Self-pay | Admitting: *Deleted

## 2017-04-23 LAB — VAGINITIS/VAGINOSIS, DNA PROBE
CANDIDA SPECIES: NEGATIVE
Gardnerella vaginalis: NEGATIVE
TRICHOMONAS VAG: NEGATIVE

## 2017-04-29 ENCOUNTER — Encounter: Payer: Self-pay | Admitting: Obstetrics & Gynecology

## 2017-12-21 ENCOUNTER — Ambulatory Visit: Payer: BC Managed Care – PPO | Admitting: Family Medicine

## 2017-12-21 DIAGNOSIS — R05 Cough: Secondary | ICD-10-CM

## 2017-12-21 DIAGNOSIS — R059 Cough, unspecified: Secondary | ICD-10-CM

## 2017-12-21 DIAGNOSIS — J0141 Acute recurrent pansinusitis: Secondary | ICD-10-CM

## 2017-12-21 DIAGNOSIS — J324 Chronic pansinusitis: Secondary | ICD-10-CM | POA: Diagnosis not present

## 2017-12-21 MED ORDER — CLARITHROMYCIN ER 500 MG PO TB24: 1000.0000 mg | ORAL_TABLET | Freq: Every day | ORAL | 0 refills | Status: DC

## 2017-12-21 NOTE — Progress Notes (Signed)
Patient ID: Mercedes Barry, female    DOB: 05/08/1975  Age: 42 y.o. MRN: [id number]  Chief Complaint  Patient presents with  . Sinusitis    x one week plus, fatigued and hx of sinus infections, vey fatigued, both ears clogged up, sinus pressure over eyes  and under eyes and painful.    Per pt using nettie pot and saline spray.  Per pt antibiotic starting a with letter "d"  and it clears up infection, per pt zpak is no effective for her sinuitis.    Subjective:  42 year old lady who has a history of a lot of sinusitis in the past.  She has been having a flare over the last 4.  She has pain and pressure in her maxillary and frontal sinus regions.  She has postnasal drainage and a cough from that.  She does not smoke.  She has had previous surgeries on her sinuses.  Current allergies, medications, problem list, past/family and social histories reviewed.  Objective:  There were no vitals taken for this visit. No major distress.  TMs normal.  Sinuses are tender in the frontal and maxillary regions.  Throat clear.  Neck supple without nodes.  Clear to auscultation.   Assessment & Plan:   Assessment: 1. Acute recurrent pansinusitis   2. Chronic pansinusitis   3. Cough       Plan:  See instructions  No orders of the defined types were placed in this encounter.   No orders of the defined types were placed in this encounter.        Patient Instructions    Drink plenty of fluids and get enough rest  Take the Biaxin XL (clindamycin) 1000 mg daily for sinusitis  Continue using your fluticasone (Flonase) nose spray.  Follow-up if further problems.   If you have lab work done today you will be contacted with your lab results within the next 2 weeks.  If you have not heard from us then please contact us. The fastest way to get your results is to register for My Chart.   IF you received an x-ray today, you will receive an invoice from [facility]. Please contact  [facility] at [phone] with questions or concerns regarding your invoice.   IF you received labwork today, you will receive an invoice from [facility]. Please contact [facility] at [phone] with questions or concerns regarding your invoice.   Our billing staff will not be able to assist you with questions regarding bills from these companies.  You will be contacted with the lab results as soon as they are available. The fastest way to get your results is to activate your My Chart account. Instructions are located on the last page of this paperwork. If you have not heard from us regarding the results in 2 weeks, please contact this office.        No follow-ups on file.   Janace Hoardavid Elza Sortor, MD 12/21/2017

## 2017-12-21 NOTE — Patient Instructions (Addendum)
  Drink plenty of fluids and get enough rest  Take the Biaxin XL (clindamycin) 1000 mg daily for sinusitis  Continue using your fluticasone (Flonase) nose spray.  Follow-up if further problems.   If you have lab work done today you will be contacted with your lab results within the next 2 weeks.  If you have not heard from us then please contact us. The fastest way to get your results is to register for My Chart.   IF you received an x-ray today, you will receive an invoice from [facility]. Please contact [facility] at [phone] with questions or concerns regarding your invoice.   IF you received labwork today, you will receive an invoice from [facility]. Please contact [facility] at [phone] with questions or concerns regarding your invoice.   Our billing staff will not be able to assist you with questions regarding bills from these companies.  You will be contacted with the lab results as soon as they are available. The fastest way to get your results is to activate your My Chart account. Instructions are located on the last page of this paperwork. If you have not heard from us regarding the results in 2 weeks, please contact this office.

## 2018-04-25 ENCOUNTER — Other Ambulatory Visit: Payer: Self-pay | Admitting: Obstetrics & Gynecology

## 2018-04-25 NOTE — Telephone Encounter (Signed)
Medication refill request: Isibloom 0.15-30 mg-mcg Last AEX:  04/22/2017 Next AEX: 07/11/2018 Last MMG (if hormonal medication request): 04/19/2017 Birads 1 negative, unable to have another screening at this time due to Covid 19 Refill authorized: Isibloom #3 0RF

## 2018-07-11 ENCOUNTER — Encounter: Payer: Self-pay | Admitting: Obstetrics & Gynecology

## 2018-07-11 ENCOUNTER — Other Ambulatory Visit: Payer: Self-pay

## 2018-07-11 ENCOUNTER — Telehealth: Payer: Self-pay | Admitting: *Deleted

## 2018-07-11 ENCOUNTER — Ambulatory Visit: Payer: BC Managed Care – PPO | Admitting: Obstetrics & Gynecology

## 2018-07-11 ENCOUNTER — Other Ambulatory Visit: Payer: Self-pay | Admitting: Obstetrics & Gynecology

## 2018-07-11 ENCOUNTER — Other Ambulatory Visit (HOSPITAL_COMMUNITY)
Admission: RE | Admit: 2018-07-11 | Discharge: 2018-07-11 | Disposition: A | Discharge status: Home / Self Care | Payer: BC Managed Care – PPO | Source: Ambulatory Visit | Attending: Obstetrics & Gynecology | Admitting: Obstetrics & Gynecology

## 2018-07-11 VITALS — BP 132/78 | HR 88 | Temp 97.5°F | Ht <= 58 in | Wt 149.0 lb

## 2018-07-11 DIAGNOSIS — Z124 Encounter for screening for malignant neoplasm of cervix: Secondary | ICD-10-CM

## 2018-07-11 DIAGNOSIS — Z1231 Encounter for screening mammogram for malignant neoplasm of breast: Secondary | ICD-10-CM

## 2018-07-11 DIAGNOSIS — Z01419 Encounter for gynecological examination (general) (routine) without abnormal findings: Secondary | ICD-10-CM | POA: Diagnosis not present

## 2018-07-11 DIAGNOSIS — Q969 Turner's syndrome, unspecified: Secondary | ICD-10-CM

## 2018-07-11 DIAGNOSIS — M810 Age-related osteoporosis without current pathological fracture: Secondary | ICD-10-CM

## 2018-07-11 MED ORDER — NORETHIN ACE-ETH ESTRAD-FE 1-20 MG-MCG PO TABS: 1.0000 | ORAL_TABLET | Freq: Every day | ORAL | 3 refills | Status: DC

## 2018-07-11 NOTE — Telephone Encounter (Signed)
-----   Message from Megan Salon, MD sent at 07/11/2018 11:22 AM EDT ----- Regarding: schedule 3D MMG and bmd Can you schedule 3D MMG and BMD for this pt.  She has hx of osteoporosis.  Thanks.  Vinnie Level

## 2018-07-11 NOTE — Progress Notes (Signed)
43 y.o. G0P0000 [name] here for annual exam.  Has changed schools for the fall.  Will be at [employer] in [place].    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  Mother diagnosed with breast cancer.  Maternal aunt committed suicide.  Father diagnosed with pulmonary hypertension.  Maternal father is 33 and does need help with care but he still drives and has good cognition.  Mother did genetic testing and it was negative.  Her MGM had breast cancer in her 72's.  Pt reports the breast cancers were different types (as reported to her by her mother).  Denies vaginal bleeding.  Occasionally will have some spotting.  Due to hypertension, have discussed lowering estrogen dosage in OCP.  Will do that now.  Will plan to lower further and then transition to HRT.  Patient's last menstrual period was 06/19/2018 (approximate).          Sexually active: No.  The current method of family planning is OCP (estrogen/progesterone).    Exercising: Yes.    walk Smoker:  no  Health Maintenance: Pap:  08/30/15 Neg. HR HPV:neg   07/20/13 ASCUS. HR HPV:neg  History of abnormal Pap:  yes MMG:  04/19/17 BIRADS1:neg.  Colonoscopy:  2017 polyp BMD:   07/31/13 Osteoporosis  TDaP: Due.  Declines today.   Screening Labs: PCP (Dr. Darron Doom)   reports that she has never smoked. She has never used smokeless tobacco. She reports that she does not drink alcohol or use drugs.  Past Medical History:  Diagnosis Date  . Clostridium difficile colitis 2011  . Hypertension   . Hypothyroidism   . Osteoporosis   . Turner syndrome    XO karyotype, no mosaicism  . Vitamin D deficiency     Past Surgical History:  Procedure Laterality Date  . left ear reconstruction x2    . NASAL SINUS SURGERY      Current Outpatient Medications  Medication Sig Dispense Refill  . ISIBLOOM 0.15-30 MG-MCG tablet TAKE 1 TABLET BY MOUTH  DAILY 3 Package 0  . levothyroxine (SYNTHROID, LEVOTHROID) 112 MCG tablet Take 1 tablet (112 mcg total) by mouth daily before breakfast. 90 tablet 3  . lisinopril (PRINIVIL,ZESTRIL) 10 MG tablet Take 1 tablet by mouth daily.  0   No current facility-administered medications for this visit.     Family History  Problem Relation Age of Onset  . Osteoporosis Mother   . Breast cancer Mother 26  . BRCA 1/2 Maternal Grandmother 82    Review of Systems  All other systems reviewed and are negative.   Exam:   BP 132/78   Pulse 88   Temp (!) 97.5 F (36.4 C) (Temporal)   Ht 4' 9.25" (1.454 m)   Wt 149 lb (67.6 kg)   LMP 06/19/2018 (Approximate)   BMI 31.96 kg/m    Height: 4' 9.25" (145.4 cm)  Ht Readings from Last 3 Encounters:  07/11/18 4' 9.25" (1.454 m)  04/22/17 4' 9.25" (1.454 m)  10/29/16 4' 11"  (1.499 m)    General appearance: alert, cooperative and appears stated age Head: Normocephalic, without obvious abnormality, atraumatic Neck: no adenopathy, supple, symmetrical, trachea midline and thyroid normal to inspection and palpation Lungs: clear to auscultation bilaterally Breasts: normal appearance, no masses or tenderness Heart: regular rate and rhythm Abdomen: soft, non-tender; bowel sounds normal; no masses,  no organomegaly Extremities: extremities normal, atraumatic, no cyanosis or  edema Skin: Skin color, texture, turgor normal. No rashes or lesions Lymph nodes: Cervical, supraclavicular, and axillary nodes normal. No abnormal inguinal nodes palpated Neurologic: Grossly normal   Pelvic: External genitalia:  no lesions              Urethra:  normal appearing urethra with no masses, tenderness or lesions              Bartholins and Skenes: normal                 Vagina: narrow introitus, normal appearing vagina with normal color and discharge, no lesions              Cervix: no lesions              Pap taken: Yes.   Bimanual Exam:  Uterus:  normal size, contour,  position, consistency, mobility, non-tender              Adnexa: normal adnexa and no mass, fullness, tenderness               Rectovaginal: Confirms               Anus:  normal sphincter tone, no lesions  Chaperone was present for exam.  A:  Well Woman with normal exam Turner's Syndrome, no mosaicism with XO karyotype Osteoporosis H/O c diff colitis 2011.  Colonoscopy 7/17. Hypothyroidism Well controlled hypertension Hearing loss On OCPS Alopecia--has seen Dr. Leonie Green at [facility] hx of breast cancer in her mother (with negative genetic testing) and in MGM  P:   Mammogram guidelines reviewed.  Lowry model done today.  20% lifetime risk of breast cancer.  Breast MRI recommended.  Plan BMD with MMG.  My office will schedule this for pt. pap smear obtained today Last echo 9/15 with Dr. Gwenlyn Found.  Follow up recommended in 5 years.  Would like provider with some expertise with Turner's Syndrome if possible.   Tdap due.  Pt declines.  She is aware this is due. Decrease OCP to loestrin 1/20 daily.  #80mosupply/4RF return annually or prn

## 2018-07-11 NOTE — Telephone Encounter (Signed)
Last BMD at [facility] on 07/31/13. Confirmed with patient wants to schedule BMD at [facility].   Call to [facility], spoke with [place]. Screening MMG and BMD scheduled for 6/22 at 2pm, arriving at 1:40pm. Stop all calcium supplements or multivatims 48 hrs prior to appt. Covid 19 precautions.   Call to patient, advised as seen above. Patient agreeable to date and time.   Routing to provider for final review. Patient is agreeable to disposition. Will close encounter.

## 2018-07-14 ENCOUNTER — Ambulatory Visit
Admission: RE | Admit: 2018-07-14 | Discharge: 2018-07-14 | Disposition: A | Discharge status: Home / Self Care | Payer: BC Managed Care – PPO | Source: Ambulatory Visit | Attending: Obstetrics & Gynecology | Admitting: Obstetrics & Gynecology

## 2018-07-14 DIAGNOSIS — Z1231 Encounter for screening mammogram for malignant neoplasm of breast: Secondary | ICD-10-CM

## 2018-07-14 DIAGNOSIS — M810 Age-related osteoporosis without current pathological fracture: Secondary | ICD-10-CM

## 2018-07-14 LAB — CYTOLOGY - PAP: Diagnosis: NEGATIVE

## 2018-07-18 ENCOUNTER — Other Ambulatory Visit: Payer: Self-pay | Admitting: *Deleted

## 2018-07-18 DIAGNOSIS — M810 Age-related osteoporosis without current pathological fracture: Secondary | ICD-10-CM

## 2018-08-27 ENCOUNTER — Telehealth: Payer: Self-pay | Admitting: Obstetrics & Gynecology

## 2018-08-27 NOTE — Telephone Encounter (Signed)
Call placed to convey referral details. Patient is aware of scheduled appointment 10/20/18@8 :15 with Dr. Johnsie Cancel.

## 2018-10-06 NOTE — Progress Notes (Deleted)
CARDIOLOGY CONSULT NOTE       Patient ID: Mercedes Barry MRN: [id number] DOB/AGE: Dec 03, 1975 43 y.o.  Admit date: (Not on file) Referring Physician: Darron Barry Primary Physician: Mercedes Rasmussen, MD Primary Cardiologist: Mercedes Barry Reason for Consultation: Turners Syndrome   Active Problems:   * No active hospital problems. *   HPI:  43 y.o. with HTN, low thyroid and Turner Syndrome Multiple issues with sinusitis and previous surgeries She works as a Agricultural engineer. Stress taking care of parents. Last seen by Mercedes Barry for same 2015. 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 Clostridium difficile colitis 2011  . Hypertension   . Hypothyroidism   . Osteoporosis   . Turner syndrome    XO karyotype, no mosaicism  . Vitamin D deficiency     Family History  Problem Relation Age of Onset  . Osteoporosis Mother   . Breast cancer Mother 39       diagnosed 2019  . Breast cancer Maternal Grandmother 14    Social History   Socioeconomic History  . Marital status: Single    Spouse name: engaged-Mercedes Barry  . Number of children: 0  . Years of education: Not on file  . Highest education level: Not on file  Occupational History  . Occupation: Product manager: [employer]  . Financial resource strain: Not on file  . Food insecurity    Worry: Not on file    Inability: Not on file  . Transportation needs    Medical: Not on file    Non-medical: Not on file  Tobacco Use  . Smoking status: Never Smoker  . Smokeless tobacco: Never Used  Substance and Sexual Activity  . Alcohol use: No  . Drug use: No  . Sexual activity: Not Currently   Partners: Male    Birth control/protection: Pill  Lifestyle  . Physical activity    Days per week: Not on file    Minutes per session: Not on file  . Stress: Not on file  Relationships  . Social Herbalist on phone: Not on file    Gets together: Not on file    Attends religious service: Not on file    Active member of club or organization: Not on file    Attends meetings of clubs or organizations: Not on file    Relationship status: Not on file  . Intimate partner violence    Fear of current or ex partner: Not on file    Emotionally abused: Not on file    Physically abused: Not on file    Forced sexual activity: Not on file  Other Topics Concern  . Not on file  Social History Narrative   Lives with her fiance, Mercedes Barry. They plan to marry in 10/2015. Her parents live in [place], [place].    Past Surgical History:  Procedure Laterality Date  . left ear reconstruction x2    . NASAL SINUS SURGERY          Physical Exam: There were no vitals taken for this visit.    Affect appropriate Turners body habitus HEENT: poor hearing short  neck low set ears  Neck supple with no adenopathy JVP normal no bruits no thyromegaly Lungs clear with no wheezing and good diaphragmatic motion Heart:  S1/S2 no murmur, no rub, gallop or click PMI normal Abdomen: benighn, BS positve, no tenderness, no AAA no bruit.  No HSM or HJR Distal pulses intact with no bruits No edema Neuro non-focal Skin warm and dry No muscular weakness  Labs:   Lab Results  Component Value Date   WBC 12.6 (H) 08/16/2015   HGB 13.4 08/16/2015   HCT 40.7 08/16/2015   MCV 89.3 08/16/2015   PLT 342 08/16/2015   No results for input(s): NA, K, CL, CO2, BUN, CREATININE, CALCIUM, PROT, BILITOT, ALKPHOS, ALT, AST, GLUCOSE in the last 168 hours.  Invalid input(s): LABALBU No results Barry for: CKTOTAL, CKMB, CKMBINDEX, TROPONINI  Lab Results  Component Value Date   CHOL 165 07/04/2014   Lab Results   Component Value Date   HDL 79 07/04/2014   Lab Results  Component Value Date   LDLCALC 69 07/04/2014   Lab Results  Component Value Date   TRIG 84 07/04/2014   Lab Results  Component Value Date   CHOLHDL 2.1 07/04/2014   No results Barry for: LDLDIRECT    Radiology: No results Barry.  EKG: NSR rate 90 normal ECG September 2015   ASSESSMENT AND PLAN:   1. Turners Syndrome :  Two previous echo's with no signs of congenital heart issues. No bicuspid valve coarctation or aortic root enlargement *** 2. Thyroid:  Continue replacement labs with primary  3. Sinus:  F/u Mercedes Barry recurrent infections  4. HTN:  On chronic ACE Rx controlled low sodium diet   Signed: Charlton Hawseter Marlaya Barry 10/06/2018, 8:07 PM

## 2018-10-20 ENCOUNTER — Ambulatory Visit: Payer: BC Managed Care – PPO | Admitting: Cardiovascular Disease

## 2019-03-22 ENCOUNTER — Ambulatory Visit: Payer: BC Managed Care – PPO | Attending: Family Medicine

## 2019-03-22 DIAGNOSIS — Z23 Encounter for immunization: Secondary | ICD-10-CM | POA: Insufficient documentation

## 2019-03-22 NOTE — Progress Notes (Signed)
   Covid-19 Vaccination Clinic  Name:  Mercedes Barry    MRN: [id number] DOB: [birth year]  03/22/2019  [name] was observed post Covid-19 immunization for 15 minutes without incidence. She was provided with Vaccine Information Sheet and instruction to access the V-Safe system.   [name] was instructed to call 911 with any severe reactions post vaccine: Marland Kitchen Difficulty breathing  . Swelling of your face and throat  . A fast heartbeat  . A bad rash all over your body  . Dizziness and weakness    Immunizations Administered    Name Date Dose VIS Date Route   Pfizer COVID-19 Vaccine 03/22/2019  2:03 PM 0.3 mL 01/02/2019 Intramuscular   Manufacturer: [employer], [employer]   Lot: [id number]   NDC: [address]-7

## 2019-04-15 ENCOUNTER — Ambulatory Visit: Payer: BC Managed Care – PPO | Attending: Internal Medicine

## 2019-04-15 DIAGNOSIS — Z23 Encounter for immunization: Secondary | ICD-10-CM

## 2019-04-15 NOTE — Progress Notes (Signed)
   Covid-19 Vaccination Clinic  Name:  Mercedes Barry    MRN: [id number] DOB: [birth year]  04/15/2019  Mercedes Barry was observed post Covid-19 immunization for 15 minutes without incident. She was provided with Vaccine Information Sheet and instruction to access the V-Safe system.   Mercedes Barry was instructed to call 911 with any severe reactions post vaccine: Marland Kitchen Difficulty breathing  . Swelling of face and throat  . A fast heartbeat  . A bad rash all over body  . Dizziness and weakness   Immunizations Administered    Name Date Dose VIS Date Route   Pfizer COVID-19 Vaccine 04/15/2019  3:33 PM 0.3 mL 01/02/2019 Intramuscular   Manufacturer: [employer], [employer]   Lot: [id number]   NDC: [address]-9

## 2019-06-16 ENCOUNTER — Other Ambulatory Visit: Payer: Self-pay | Admitting: Obstetrics & Gynecology

## 2019-06-17 ENCOUNTER — Other Ambulatory Visit: Payer: Self-pay

## 2019-06-17 NOTE — Telephone Encounter (Signed)
Medication refill request: Aurovela  Last AEX:  07-11-2018 SM  Next AEX: 09-18-19 Last MMG (if hormonal medication request):07-15-2018 density B/BIRADS 1 negative  Refill authorized: Today, please advise.   Medication pended for #84, 0RF. Please refill if appropriate.

## 2019-06-17 NOTE — Telephone Encounter (Signed)
Patient is calling to check on status for Aurovela.

## 2019-06-17 NOTE — Telephone Encounter (Signed)
Detailed message left on mobile number per DPR advising patient that refill for Aurovela had been sent to pharmacy. Advised patient to return call with any questions. Patient agreeable.   Encounter closed.

## 2019-07-29 ENCOUNTER — Encounter: Payer: Self-pay | Admitting: Obstetrics & Gynecology

## 2019-08-03 ENCOUNTER — Telehealth: Payer: Self-pay | Admitting: *Deleted

## 2019-08-03 NOTE — Telephone Encounter (Signed)
Left message on voicemail to call and reschedule cancelled appointment. °

## 2019-08-14 ENCOUNTER — Other Ambulatory Visit: Payer: Self-pay | Admitting: Obstetrics & Gynecology

## 2019-08-14 DIAGNOSIS — Z1231 Encounter for screening mammogram for malignant neoplasm of breast: Secondary | ICD-10-CM

## 2019-08-28 ENCOUNTER — Ambulatory Visit: Payer: BC Managed Care – PPO

## 2019-08-31 ENCOUNTER — Other Ambulatory Visit: Payer: Self-pay

## 2019-08-31 ENCOUNTER — Ambulatory Visit
Admission: RE | Admit: 2019-08-31 | Discharge: 2019-08-31 | Disposition: A | Discharge status: Home / Self Care | Payer: BC Managed Care – PPO | Source: Ambulatory Visit | Attending: Obstetrics & Gynecology | Admitting: Obstetrics & Gynecology

## 2019-08-31 DIAGNOSIS — Z1231 Encounter for screening mammogram for malignant neoplasm of breast: Secondary | ICD-10-CM

## 2019-09-03 ENCOUNTER — Ambulatory Visit: Payer: BC Managed Care – PPO | Admitting: Obstetrics & Gynecology

## 2019-09-07 ENCOUNTER — Other Ambulatory Visit: Payer: Self-pay | Admitting: Obstetrics & Gynecology

## 2019-09-10 ENCOUNTER — Other Ambulatory Visit: Payer: Self-pay | Admitting: Obstetrics & Gynecology

## 2019-09-10 DIAGNOSIS — Z01419 Encounter for gynecological examination (general) (routine) without abnormal findings: Secondary | ICD-10-CM

## 2019-09-10 NOTE — Telephone Encounter (Signed)
Patient requesting refill on Aurovela. Patient is currently on Dr Rondel Baton waitlist. 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

## 2019-09-11 NOTE — Telephone Encounter (Signed)
Patient is calling to check on status of refill

## 2019-09-11 NOTE — Telephone Encounter (Signed)
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 Please advise if refill appropriate.   Julious Payer, CMA

## 2019-09-15 ENCOUNTER — Other Ambulatory Visit: Payer: Self-pay | Admitting: Obstetrics & Gynecology

## 2019-09-15 MED ORDER — NORETHIN ACE-ETH ESTRAD-FE 1-20 MG-MCG PO TABS: 1.0000 | ORAL_TABLET | Freq: Every day | ORAL | 2 refills | Status: DC

## 2019-09-15 NOTE — Telephone Encounter (Signed)
Patient is checking on status of refill. She is on wait list for appointment.

## 2019-09-15 NOTE — Telephone Encounter (Signed)
Tried calling patient to notify that prescription has been sent to pharmacy for her birth control. No answer, left patient a detailed message per [facility] and advised patient if any questions to give our office a call at [phone].

## 2019-09-18 ENCOUNTER — Ambulatory Visit: Payer: BC Managed Care – PPO | Admitting: Obstetrics & Gynecology

## 2019-11-02 ENCOUNTER — Other Ambulatory Visit: Payer: Self-pay | Admitting: Obstetrics & Gynecology

## 2019-11-02 DIAGNOSIS — Z01419 Encounter for gynecological examination (general) (routine) without abnormal findings: Secondary | ICD-10-CM

## 2019-11-02 NOTE — Telephone Encounter (Signed)
Medication refill request: Aurovela 1/20 Last AEX:  09/10/19 Next AEX: not yet scheduled Last MMG (if hormonal medication request): 08/31/19  Neg  Refill authorized: 28/2

## 2019-11-04 NOTE — Telephone Encounter (Signed)
Can you please call pt about an appointment please?  Thanks.

## 2019-11-05 NOTE — Telephone Encounter (Signed)
Left message for pt to call regarding appt.   Corrected information -   Medication refill request: Norethindrone 1/20 Last AEX:  07/11/18 Next AEX: not scheduled Last MMG (if hormonal medication request): 08/31/19  Neg

## 2019-11-14 ENCOUNTER — Ambulatory Visit (HOSPITAL_COMMUNITY)
Admission: EM | Admit: 2019-11-14 | Discharge: 2019-11-14 | Disposition: A | Discharge status: Home / Self Care | Payer: BC Managed Care – PPO | Attending: Emergency Medicine | Admitting: Emergency Medicine

## 2019-11-14 ENCOUNTER — Other Ambulatory Visit: Payer: Self-pay

## 2019-11-14 ENCOUNTER — Encounter (HOSPITAL_COMMUNITY): Payer: Self-pay

## 2019-11-14 DIAGNOSIS — L03011 Cellulitis of right finger: Secondary | ICD-10-CM

## 2019-11-14 MED ORDER — SULFAMETHOXAZOLE-TRIMETHOPRIM 800-160 MG PO TABS: 1.0000 | ORAL_TABLET | Freq: Two times a day (BID) | ORAL | 0 refills | Status: AC

## 2019-11-14 NOTE — Discharge Instructions (Addendum)
Take the antibiotic as directed.  Keep the area clean and dry.  Wash it gently twice a day with soap and water.  Apply an antibiotic cream twice a day.    Follow up with your primary care provider if your symptoms are not improving.

## 2019-11-14 NOTE — ED Triage Notes (Signed)
Pt present right hand/ring finger swelling. Pt noticed it 1 week ago. Pt states its hot to the touch and tender.

## 2019-11-14 NOTE — ED Provider Notes (Signed)
[facility]    CSN: [id number] Arrival date & time: 11/14/19  1039      History   Chief Complaint Chief Complaint  Patient presents with  . Hand Pain    Right hand/ring finger    HPI Mercedes Barry is a 44 y.o. female.  Patient presents with 1 week history of pain, swelling, redness, skin peeling of her right ring finger from the DIP to tip. No known injury. Her symptoms started after getting her nails done. No open wounds or drainage. No fever or chills. No numbness, weakness, paresthesias. Treatment attempted at home with Neosporin.  The history is provided by the patient.    Past Medical History:  Diagnosis Date  . Clostridium difficile colitis 2011  . Hypertension   . Hypothyroidism   . Osteoporosis   . Turner syndrome    XO karyotype, no mosaicism  . Vitamin D deficiency     Patient Active Problem List   Diagnosis Date Noted  . 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 Seasonal allergies 03/23/2014  . Thyroid activity decreased 02/02/2014  . Turner's syndrome 09/25/2013  . Essential hypertension 06/17/2013    Past Surgical History:  Procedure Laterality Date  . left ear reconstruction x2    . NASAL SINUS SURGERY      OB History    Gravida  0   Para  0   Term  0   Preterm  0   AB  0   Living  0     SAB  0   TAB  0   Ectopic  0   Multiple  0   Live Births               Home Medications    Prior to Admission medications   Medication Sig Start Date End Date Taking? Authorizing Provider  levothyroxine (SYNTHROID, LEVOTHROID) 112 MCG tablet Take 1 tablet (112 mcg total) by mouth daily before breakfast. 07/06/14   Sherren Mocha, MD  lisinopril (PRINIVIL,ZESTRIL) 10 MG tablet Take 1 tablet by mouth daily. 01/21/17   [provider]  norethindrone-ethinyl estradiol (AUROVELA FE 1/20) 1-20 MG-MCG tablet Take 1 tablet by mouth daily. 09/15/19   Jerene Bears, MD  sulfamethoxazole-trimethoprim (BACTRIM DS) 800-160 MG  tablet Take 1 tablet by mouth 2 (two) times daily for 7 days. 11/14/19 11/21/19  Mickie Bail, NP    Family History Family History  Problem Relation Age of Onset  . Osteoporosis Mother   . Breast cancer Mother 62       diagnosed 2019  . Breast cancer Maternal Grandmother 56    Social History Social History   Tobacco Use  . Smoking status: Never Smoker  . Smokeless tobacco: Never Used  Vaping Use  . Vaping Use: Never used  Substance Use Topics  . Alcohol use: No  . Drug use: No     Allergies   Entex, Penicillins, Augmentin [amoxicillin-pot clavulanate], and Cephalosporins   Review of Systems Review of Systems  Constitutional: Negative for chills and fever.  HENT: Negative for ear pain and sore throat.   Eyes: Negative for pain and visual disturbance.  Respiratory: Negative for cough and shortness of breath.   Cardiovascular: Negative for chest pain and palpitations.  Gastrointestinal: Negative for abdominal pain and vomiting.  Genitourinary: Negative for dysuria and hematuria.  Musculoskeletal: Negative for arthralgias and back pain.  Skin: Positive for color change and rash.  Neurological: Negative for syncope, weakness and numbness.  All other systems reviewed and are negative.    Physical Exam Triage Vital Signs ED Triage Vitals  Enc Vitals Group     BP 11/14/19 1110 125/79     Pulse Rate 11/14/19 1110 87     Resp 11/14/19 1110 16     Temp 11/14/19 1110 98.4 F (36.9 C)     Temp Source 11/14/19 1110 Oral     SpO2 11/14/19 1110 100 %     Weight --      Height --      Head Circumference --      Peak Flow --      Pain Score 11/14/19 1113 0     Pain Loc --      Pain Edu? --      Excl. in GC? --    No data found.  Updated Vital Signs BP 125/79 (BP Location: Right Arm)   Pulse 87   Temp 98.4 F (36.9 C) (Oral)   Resp 16   SpO2 100%   Visual Acuity Right Eye Distance:   Left Eye Distance:   Bilateral Distance:    Right Eye Near:   Left Eye  Near:    Bilateral Near:     Physical Exam Vitals and nursing note reviewed.  Constitutional:      General: She is not in acute distress.    Appearance: She is well-developed. She is not ill-appearing.  HENT:     Head: Normocephalic and atraumatic.     Mouth/Throat:     Mouth: Mucous membranes are moist.  Eyes:     Conjunctiva/sclera: Conjunctivae normal.  Cardiovascular:     Rate and Rhythm: Normal rate and regular rhythm.     Heart sounds: Normal heart sounds.  Pulmonary:     Effort: Pulmonary effort is normal. No respiratory distress.     Breath sounds: Normal breath sounds.  Abdominal:     Palpations: Abdomen is soft.     Tenderness: There is no abdominal tenderness.  Musculoskeletal:        General: Normal range of motion.     Cervical back: Neck supple.  Skin:    General: Skin is warm and dry.     Capillary Refill: Capillary refill takes less than 2 seconds.     Findings: Erythema and rash present. No bruising or lesion.     Comments: Right ring finger: erythematous, mildly edematous, and tender from DIP to tip. No open wound or drainage.  No apparent area to drain. Skin is dry and peeling.   Neurological:     General: No focal deficit present.     Mental Status: She is alert and oriented to person, place, and time.     Sensory: No sensory deficit.     Motor: No weakness.     Gait: Gait normal.  Psychiatric:        Mood and Affect: Mood normal.        Behavior: Behavior normal.      UC Treatments / Results  Labs (all labs ordered are listed, but only abnormal results are displayed) Labs Reviewed - No data to display  EKG   Radiology No results found.  Procedures Procedures (including critical care time)  Medications Ordered in UC Medications - No data to display  Initial Impression / Assessment and Plan / UC Course  I have reviewed the triage vital signs and the nursing notes.  Pertinent labs & imaging results that were available during my care of  the patient were reviewed by me and considered in my medical decision making (see chart for details).   Cellulitis of right ring finger.  Treating with Septra DS.  Wound care instructions and signs of worsening infection discussed with patient.  Instructed her to follow-up with her PCP if her symptoms are not improving.  Patient agrees to plan of care.   Final Clinical Impressions(s) / UC Diagnoses   Final diagnoses:  Cellulitis of right ring finger     Discharge Instructions     Take the antibiotic as directed.  Keep the area clean and dry.  Wash it gently twice a day with soap and water.  Apply an antibiotic cream twice a day.    Follow up with your primary care provider if your symptoms are not improving.        ED Prescriptions    Medication Sig Dispense Auth. Provider   sulfamethoxazole-trimethoprim (BACTRIM DS) 800-160 MG tablet Take 1 tablet by mouth 2 (two) times daily for 7 days. 14 tablet Mickie Bail, NP     PDMP not reviewed this encounter.   Mickie Bail, NP 11/14/19 1158

## 2019-12-02 ENCOUNTER — Other Ambulatory Visit: Payer: Self-pay | Admitting: Obstetrics & Gynecology

## 2019-12-02 DIAGNOSIS — Z01419 Encounter for gynecological examination (general) (routine) without abnormal findings: Secondary | ICD-10-CM

## 2019-12-03 ENCOUNTER — Telehealth: Payer: Self-pay

## 2019-12-03 MED ORDER — NORETHIN ACE-ETH ESTRAD-FE 1-20 MG-MCG PO TABS: 1.0000 | ORAL_TABLET | Freq: Every day | ORAL | 0 refills | Status: DC

## 2019-12-03 NOTE — Telephone Encounter (Signed)
Patient is scheduled for AEX on (12/10/19).

## 2019-12-03 NOTE — Telephone Encounter (Signed)
Patient is calling in regards to having a refill of birth control.

## 2019-12-03 NOTE — Addendum Note (Signed)
Addended by: Julious Payer C on: 12/03/2019 09:07 AM   Modules accepted: Orders

## 2019-12-03 NOTE — Telephone Encounter (Signed)
She was last seen 07/11/18 - she needs to schedule a physical for refill authorization.   Mercedes Barry

## 2019-12-03 NOTE — Telephone Encounter (Signed)
Opened in error

## 2019-12-03 NOTE — Telephone Encounter (Signed)
Medication refill request: Aurovela FE 1/20 Last AEX:  07/11/18  Next AEX: 12/10/19 Last MMG (if hormonal medication request): 08/31/19 Neg  Refill authorized: 28/0

## 2019-12-08 NOTE — Progress Notes (Signed)
44 y.o. G0P0000 [name] here for annual exam.  Denies any new concerns.   Last year decreased estrogen to 20 mcg, now only has a one day period. She is doing okay with it.  Is doing a weight loss program called [employer]. Has lost 9 lbs since last year.  Both mother and MGM had breast cancer (different kinds) Genetic screening for Mom's cancer was negative.   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  Patient's last menstrual period was 12/03/2019 (exact date).          Sexually active: No.  The current method of family planning is none and OCP (estrogen/progesterone).    Exercising: Yes.    walks Smoker:  no  Health Maintenance: Pap:  08-30-15 neg HPV HR neg, 07-11-2018 neg History of abnormal Pap:  yes MMG:  08-31-2019 category b density birads 1:neg Colonoscopy:  07-29-2019 BMD:   07-14-2018 osteoporosis referred to Dr. Talmage Nap, pt did not go (endocrinologist) TDaP:  Posey Rea  Gardasil:   NA Covid-19: pfizer done Pneumonia vaccine(s):  Never  Shingrix:   Never Hep C testing: never  Screening Labs: PCP    reports that she has never smoked. She has never used smokeless tobacco. She reports that she does not drink alcohol and does not use drugs.  Past Medical History:  Diagnosis Date  . Clostridium difficile colitis 2011  . Hypertension   . Hypothyroidism   . Osteoporosis   . Turner syndrome    XO karyotype, no mosaicism  . Vitamin D deficiency     Past Surgical History:  Procedure Laterality Date  . left ear reconstruction x2    . NASAL SINUS SURGERY      Current Outpatient Medications  Medication Sig Dispense Refill  . levothyroxine (SYNTHROID, LEVOTHROID) 112 MCG tablet Take 1 tablet (112 mcg total) by mouth daily before breakfast. 90  tablet 3  . lisinopril (PRINIVIL,ZESTRIL) 10 MG tablet Take 1 tablet by mouth daily.  0  . norethindrone-ethinyl estradiol (AUROVELA FE 1/20) 1-20 MG-MCG tablet Take 1 tablet by mouth daily. 28 tablet 0   No current facility-administered medications for this visit.    Family History  Problem Relation Age of Onset  . Osteoporosis Mother   . Breast cancer Mother 5       diagnosed 2019  . Breast cancer Maternal Grandmother 38    Review of Systems  All other systems reviewed and are negative.   Exam:   BP 122/64   Pulse 62   Resp 10   Ht 4\' 9"  (1.448 m)   Wt 140 lb 6.4 oz (63.7 kg)   LMP 12/03/2019 (Exact Date)   SpO2 98%   BMI 30.38 kg/m   Height: 4\' 9"  (144.8 cm)  General appearance: alert, cooperative and appears stated age Head: Normocephalic, hair thinning/alopecia Neck: no adenopathy, supple, symmetrical, trachea midline and thyroid normal to inspection and palpation Lungs: clear to auscultation bilaterally Breasts: normal appearance, no masses or tenderness Heart: regular rate and rhythm Abdomen: soft, non-tender; bowel sounds normal; no masses,  no organomegaly Extremities: extremities normal, atraumatic, no cyanosis or edema Skin: Skin color, texture, turgor normal. No rashes or lesions Lymph nodes: Cervical, supraclavicular, and axillary nodes normal. No abnormal inguinal nodes palpated Neurologic: Grossly normal   Pelvic:  External genitalia:  no lesions              Urethra:  normal appearing urethra with no masses, tenderness or lesions              Bartholins and Skenes: normal                 Vagina: narrow introitus (use adolescent speculum) normal appearing vagina with normal color and discharge, no lesions              Cervix: no cervical motion tenderness              Pap taken: No. Bimanual Exam:  Uterus:  normal size, contour, position, consistency, mobility, non-tender              Adnexa: no mass, fullness, tenderness and pt reports she does not  have ovaries                 Mercedes Barry, CMA Chaperone was present for exam.  A:  Well Woman with normal exam  Turner's Syndrome  Using Lo-estrin for hormone therapy  Osteoporosis, Left femur neck T-score -3.2. Was referred last year to Dr. Horald Pollen. Pt does not remember what happened. She never went.  HTN, Hypothyroidism managed by PCP  P:   Mammogram due 2022  pap smear cotesting due 2025  Will re-refer to Endocrinology for osteoporosis  Discussed reducing risk of fall, taking adequate Vit D  Refill  OCP x 1 year return annually or prn

## 2019-12-10 ENCOUNTER — Other Ambulatory Visit: Payer: Self-pay

## 2019-12-10 ENCOUNTER — Ambulatory Visit: Payer: BC Managed Care – PPO | Admitting: Nurse Practitioner

## 2019-12-10 ENCOUNTER — Encounter: Payer: Self-pay | Admitting: Nurse Practitioner

## 2019-12-10 VITALS — BP 122/64 | HR 62 | Resp 10 | Ht <= 58 in | Wt 140.4 lb

## 2019-12-10 DIAGNOSIS — Z01419 Encounter for gynecological examination (general) (routine) without abnormal findings: Secondary | ICD-10-CM | POA: Diagnosis not present

## 2019-12-10 DIAGNOSIS — Z3041 Encounter for surveillance of contraceptive pills: Secondary | ICD-10-CM

## 2019-12-10 MED ORDER — NORETHIN ACE-ETH ESTRAD-FE 1-20 MG-MCG PO TABS: 1.0000 | ORAL_TABLET | Freq: Every day | ORAL | 0 refills | Status: DC

## 2019-12-10 NOTE — Patient Instructions (Signed)
Nice to meet you today! You will be hearing from the nurse soon about your referral to Endocrinology to manage your osteoporosis. Be careful to reduce your risk for falls/trips/injury to help prevent breaking a bone. Keep up your healthy efforts for weight loss. You are doing great. Weight bearing exercises like walking will help bone health.  See you next year.   Osteoporosis  Osteoporosis is thinning and loss of density in your bones. Osteoporosis makes bones more brittle and fragile and more likely to break (fracture). Over time, osteoporosis can cause your bones to become so weak that they fracture after a minor fall. Bones in the hip, wrist, and spine are most likely to fracture due to osteoporosis. What are the causes? The exact cause of this condition is not known. What increases the risk? You may be at greater risk for osteoporosis if you:  Have a family history of the condition.  Have poor nutrition.  Use steroid medicines, such as prednisone.  Are female.  Are age 44 or older.  Smoke or have a history of smoking.  Are not physically active (are sedentary).  Are white (Caucasian) or of Asian descent.  Have a small body frame.  Take certain medicines, such as antiseizure medicines. What are the signs or symptoms? A fracture might be the first sign of osteoporosis, especially if the fracture results from a fall or injury that usually would not cause a bone to break. Other signs and symptoms include:  Pain in the neck or low back.  Stooped posture.  Loss of height. How is this diagnosed? This condition may be diagnosed based on:  Your medical history.  A physical exam.  A bone mineral density test, also called a DXA or DEXA test (dual-energy X-ray absorptiometry test). This test uses X-rays to measure the amount of minerals in your bones. How is this treated? The goal of treatment is to strengthen your bones and lower your risk for a fracture. Treatment may  involve:  Making lifestyle changes, such as: ? Including foods with more calcium and vitamin D in your diet. ? Doing weight-bearing and muscle-strengthening exercises. ? Stopping tobacco use. ? Limiting alcohol intake.  Taking medicine to slow the process of bone loss or to increase bone density.  Taking daily supplements of calcium and vitamin D.  Taking hormone replacement medicines, such as estrogen for women and testosterone for men.  Monitoring your levels of calcium and vitamin D. Follow these instructions at home:  Activity  Exercise as told by your health care provider. Ask your health care provider what exercises and activities are safe for you. You should do: ? Exercises that make you work against gravity (weight-bearing exercises), such as tai chi, yoga, or walking. ? Exercises to strengthen muscles, such as lifting weights. Lifestyle  Limit alcohol intake to no more than 1 drink a day for nonpregnant women and 2 drinks a day for men. One drink equals 12 oz of beer, 5 oz of wine, or 1 oz of hard liquor.  Do not use any products that contain nicotine or tobacco, such as cigarettes and e-cigarettes. If you need help quitting, ask your health care provider. Preventing falls  Use devices to help you move around (mobility aids) as needed, such as canes, walkers, scooters, or crutches.  Keep rooms well-lit and clutter-free.  Remove tripping hazards from walkways, including cords and throw rugs.  Install grab bars in bathrooms and safety rails on stairs.  Use rubber mats in the bathroom  and other areas that are often wet or slippery.  Wear closed-toe shoes that fit well and support your feet. Wear shoes that have rubber soles or low heels.  Review your medicines with your health care provider. Some medicines can cause dizziness or changes in blood pressure, which can increase your risk of falling. General instructions  Include calcium and vitamin D in your diet.  Calcium is important for bone health, and vitamin D helps your body to absorb calcium. Good sources of calcium and vitamin D include: ? Certain fatty fish, such as salmon and tuna. ? Products that have calcium and vitamin D added to them (fortified products), such as fortified cereals. ? Egg yolks. ? Cheese. ? Liver.  Take over-the-counter and prescription medicines only as told by your health care provider.  Keep all follow-up visits as told by your health care provider. This is important. Contact a health care provider if:  You have never been screened for osteoporosis and you are: ? A woman who is age 44 or older. ? A man who is age 29 or older. Get help right away if:  You fall or injure yourself. Summary  Osteoporosis is thinning and loss of density in your bones. This makes bones more brittle and fragile and more likely to break (fracture),even with minor falls.  The goal of treatment is to strengthen your bones and reduce your risk for a fracture.  Include calcium and vitamin D in your diet. Calcium is important for bone health, and vitamin D helps your body to absorb calcium.  Talk with your health care provider about screening for osteoporosis if you are a woman who is age 44, or a man who is age 11 or older. or older, or a man who is age 44 or older. This information is not intended to replace advice given to you by your health care provider. Make sure you discuss any questions you have with your health care provider. Document Revised: 12/21/2016 Document Reviewed: 11/02/2016 Elsevier Patient Education  2020 [employer].

## 2019-12-11 ENCOUNTER — Telehealth: Payer: Self-pay

## 2019-12-11 DIAGNOSIS — M810 Age-related osteoporosis without current pathological fracture: Secondary | ICD-10-CM

## 2019-12-11 DIAGNOSIS — Q969 Turner's syndrome, unspecified: Secondary | ICD-10-CM

## 2019-12-11 NOTE — Telephone Encounter (Signed)
-----   Message from Clarita Crane, NP sent at 12/10/2019  4:58 PM EST ----- Please re- refer this patient to endocrinology (Dr. Horald Pollen) Looks like she had an appointment 07/2018 but can not figured out what happened. It is very important that she goes to this appointment. She has a Left Femur T score -3.2 THANK YOU!! Tresa Endo

## 2019-12-11 NOTE — Telephone Encounter (Signed)
Order placed for Endocrinology referral to Dr Talmage Nap.  Cc: Hayley for referral.  Encounter closed  Routing to [employer], NP for update

## 2019-12-14 ENCOUNTER — Telehealth: Payer: Self-pay | Admitting: Nurse Practitioner

## 2019-12-14 NOTE — Telephone Encounter (Signed)
Called patient as a follow up to annual exam appointment (12/10/2019). Notified patient that I wanted to consult Dr. Oscar La regarding continuing her Loestrin vs changing to HRT. Dr. Oscar La suggested some ideas and I wanted to discuss.   Pt did not answer phone. Left above information as a message. Advised I will call back tomorrow, after school hours and hopefully can discuss at that time.

## 2019-12-16 ENCOUNTER — Encounter: Payer: Self-pay | Admitting: Nurse Practitioner

## 2019-12-21 NOTE — Telephone Encounter (Signed)
Attempted to call regarding OCP vs HRT. Discussed with Dr. Oscar La after appointment last week. Dr. Oscar La suggested with HTN, may want to transition sooner than later. My original thought was to continue OCP until Endocrine appointment for osteoporosis management, but wanted to discuss risks/benefits more with [facility] and get her input. Left message that I would try again later this week.

## 2019-12-25 NOTE — Telephone Encounter (Signed)
Left phone message about transitioning to HRT per Dr. Lorin Glass recommendation. Pt can return call to discuss

## 2019-12-29 NOTE — Telephone Encounter (Signed)
Patient is returning call. States can call cell number at any time.

## 2020-01-05 ENCOUNTER — Telehealth: Payer: Self-pay | Admitting: Nurse Practitioner

## 2020-01-05 DIAGNOSIS — M810 Age-related osteoporosis without current pathological fracture: Secondary | ICD-10-CM

## 2020-01-05 DIAGNOSIS — Q969 Turner's syndrome, unspecified: Secondary | ICD-10-CM

## 2020-01-05 NOTE — Telephone Encounter (Signed)
Sent the following message to patient via MyChart. If patient calls back, please have patient hold for Tysean [place].  Good afternoon Mercedes Barry,  I am contacting you regarding a referral that was placed for you to be seen by an endocrinologist. I was just wanting to follow up with you to see about getting you scheduled. If you wouldn't mind, please give me a call back at your earliest convenience at [phone]. Our phone hours are Monday through Friday, 8am-4pm with a break for lunch from 12pm-1pm.   Thank you so much,  Lolita Faulds

## 2020-01-06 NOTE — Telephone Encounter (Signed)
Nils[facility] Nutting [facility] Yes, thank you! I will call you tomorrow.

## 2020-01-06 NOTE — Telephone Encounter (Signed)
When patient returns call, please find Judeth Cornfield or Quinlynn Cuthbert to speak with the patient.  Cc: Judeth Cornfield

## 2020-01-07 NOTE — Telephone Encounter (Signed)
Spoke with patient regarding referral for endocrinology. Patient agreeable to new referral to see Dr. Talmage Coin at [place]. Patient requesting a MyChart message with Dr. Daune Perch contact information and location.  Following message sent via MyChart.  Hi Mercedes Barry,  Dr. Talmage Coin is located at [facility] at [address]. The phone number to their office is [phone].  I will send the referral over today, if you would like to follow up with their office one day next week.  Let me know if there is anything else I can do to be of assistance with getting you scheduled.  Have a great afternoon and Happy Holidays!  Mercedes Barry  Routing to triage for new referral for endocrinology to Dr. Sharl Ma at [place].  Cc: Mercedes Crane, NP as Lorain Childes.  Encounter closed.

## 2020-01-07 NOTE — Addendum Note (Signed)
Addended by: Isabell Jarvis on: 01/07/2020 12:16 PM   Modules accepted: Orders

## 2020-01-07 NOTE — Telephone Encounter (Signed)
Referral placed for Dr Talmage Coin at [facility].

## 2020-01-25 ENCOUNTER — Other Ambulatory Visit: Payer: Self-pay | Admitting: Nurse Practitioner

## 2020-01-25 DIAGNOSIS — Z01419 Encounter for gynecological examination (general) (routine) without abnormal findings: Secondary | ICD-10-CM

## 2020-01-27 ENCOUNTER — Other Ambulatory Visit: Payer: Self-pay | Admitting: Nurse Practitioner

## 2020-01-27 DIAGNOSIS — Z01419 Encounter for gynecological examination (general) (routine) without abnormal findings: Secondary | ICD-10-CM

## 2020-01-27 NOTE — Telephone Encounter (Signed)
Medication refill request: Aurovela Last AEX:  12/10/19 KD Next AEX: none Last MMG (if hormonal medication request): 08/31/19 BIRADS 1 negative/density b Refill authorized: today, please advise

## 2020-01-28 ENCOUNTER — Other Ambulatory Visit: Payer: Self-pay | Admitting: Nurse Practitioner

## 2020-01-28 ENCOUNTER — Telehealth: Payer: Self-pay

## 2020-01-28 ENCOUNTER — Encounter: Payer: Self-pay | Admitting: Nurse Practitioner

## 2020-01-28 DIAGNOSIS — Z3041 Encounter for surveillance of contraceptive pills: Secondary | ICD-10-CM

## 2020-01-28 MED ORDER — NORETHIN ACE-ETH ESTRAD-FE 1-20 MG-MCG PO TABS: 1.0000 | ORAL_TABLET | Freq: Every day | ORAL | 10 refills | Status: DC

## 2020-01-28 NOTE — Telephone Encounter (Signed)
Staff message received "Patient has questions for a triage nurse."

## 2020-01-28 NOTE — Telephone Encounter (Signed)
Patient called following up on refill request for her bcp. I assured her the request has been routed to her provider. She wanted you to know that she needs the Rx today. Thanks

## 2020-01-28 NOTE — Telephone Encounter (Signed)
Left message to call me.

## 2020-07-26 ENCOUNTER — Other Ambulatory Visit: Payer: Self-pay | Admitting: Family Medicine

## 2020-07-26 DIAGNOSIS — Z1231 Encounter for screening mammogram for malignant neoplasm of breast: Secondary | ICD-10-CM

## 2020-07-26 DIAGNOSIS — M810 Age-related osteoporosis without current pathological fracture: Secondary | ICD-10-CM

## 2020-08-15 ENCOUNTER — Other Ambulatory Visit: Payer: Self-pay

## 2020-08-15 DIAGNOSIS — Z1211 Encounter for screening for malignant neoplasm of colon: Secondary | ICD-10-CM | POA: Insufficient documentation

## 2020-08-15 DIAGNOSIS — R1011 Right upper quadrant pain: Secondary | ICD-10-CM | POA: Insufficient documentation

## 2020-08-15 DIAGNOSIS — I10 Essential (primary) hypertension: Secondary | ICD-10-CM | POA: Insufficient documentation

## 2020-08-15 DIAGNOSIS — Z800 Family history of malignant neoplasm of digestive organs: Secondary | ICD-10-CM | POA: Insufficient documentation

## 2020-08-15 DIAGNOSIS — M810 Age-related osteoporosis without current pathological fracture: Secondary | ICD-10-CM | POA: Insufficient documentation

## 2020-08-15 DIAGNOSIS — R194 Change in bowel habit: Secondary | ICD-10-CM | POA: Insufficient documentation

## 2020-08-15 DIAGNOSIS — K625 Hemorrhage of anus and rectum: Secondary | ICD-10-CM | POA: Insufficient documentation

## 2020-08-15 DIAGNOSIS — R635 Abnormal weight gain: Secondary | ICD-10-CM | POA: Insufficient documentation

## 2020-08-15 DIAGNOSIS — R197 Diarrhea, unspecified: Secondary | ICD-10-CM | POA: Insufficient documentation

## 2020-08-15 DIAGNOSIS — E669 Obesity, unspecified: Secondary | ICD-10-CM | POA: Insufficient documentation

## 2020-08-15 DIAGNOSIS — E039 Hypothyroidism, unspecified: Secondary | ICD-10-CM | POA: Insufficient documentation

## 2020-08-15 DIAGNOSIS — R1111 Vomiting without nausea: Secondary | ICD-10-CM | POA: Insufficient documentation

## 2020-08-15 DIAGNOSIS — R109 Unspecified abdominal pain: Secondary | ICD-10-CM | POA: Insufficient documentation

## 2020-08-17 ENCOUNTER — Other Ambulatory Visit: Payer: Self-pay

## 2020-08-17 ENCOUNTER — Encounter: Payer: Self-pay | Admitting: Cardiology

## 2020-08-17 ENCOUNTER — Ambulatory Visit: Payer: BC Managed Care – PPO | Admitting: Cardiology

## 2020-08-17 VITALS — BP 140/82 | HR 98 | Ht <= 58 in | Wt 129.1 lb

## 2020-08-17 DIAGNOSIS — Q969 Turner's syndrome, unspecified: Secondary | ICD-10-CM | POA: Diagnosis not present

## 2020-08-17 DIAGNOSIS — I10 Essential (primary) hypertension: Secondary | ICD-10-CM

## 2020-08-17 DIAGNOSIS — R011 Cardiac murmur, unspecified: Secondary | ICD-10-CM | POA: Diagnosis not present

## 2020-08-17 NOTE — Patient Instructions (Signed)
Medication Instructions:  No medication changes. *If you need a refill on your cardiac medications before your next appointment, please call your pharmacy*   Lab Work: None ordered If you have labs (blood work) drawn today and your tests are completely normal, you will receive your results only by: MyChart Message (if you have MyChart) OR A paper copy in the mail If you have any lab test that is abnormal or we need to change your treatment, we will call you to review the results.   Testing/Procedures: Your physician has requested that you have an echocardiogram. Echocardiography is a painless test that uses sound waves to create images of your heart. It provides your doctor with information about the size and shape of your heart and how well your heart's chambers and valves are working. This procedure takes approximately one hour. There are no restrictions for this procedure.   Non-Cardiac CT scanning, (CAT scanning), is a noninvasive, special x-ray that produces cross-sectional images of the body using x-rays and a computer. CT scans help physicians diagnose and treat medical conditions. For some CT exams, a contrast material is used to enhance visibility in the area of the body being studied. CT scans provide greater clarity and reveal more details than regular x-ray exams.   Follow-Up: At [facility], you and your health needs are our priority.  As part of our continuing mission to provide you with exceptional heart care, we have created designated Provider Care Teams.  These Care Teams include your primary Cardiologist (physician) and Advanced Practice Providers (APPs -  Physician Assistants and Nurse Practitioners) who all work together to provide you with the care you need, when you need it.  We recommend signing up for the patient portal called "MyChart".  Sign up information is provided on this After Visit Summary.  MyChart is used to connect with patients for Virtual Visits  (Telemedicine).  Patients are able to view lab/test results, encounter notes, upcoming appointments, etc.  Non-urgent messages can be sent to your provider as well.   To learn more about what you can do with MyChart, go to [email or web address].    Your next appointment:   6 month(s)  The format for your next appointment:   In Person  Provider:   Rajan Revankar, MD   Other Instructions Echocardiogram An echocardiogram is a test that uses sound waves (ultrasound) to produce images of the heart. Images from an echocardiogram can provide important information about: Heart size and shape. The size and thickness and movement of your heart's walls. Heart muscle function and strength. Heart valve function or if you have stenosis. Stenosis is when the heart valves are too narrow. If blood is flowing backward through the heart valves (regurgitation). A tumor or infectious growth around the heart valves. Areas of heart muscle that are not working well because of poor blood flow or injury from a heart attack. Aneurysm detection. An aneurysm is a weak or damaged part of an artery wall. The wall bulges out from the normal force of blood pumping through the body. Tell a health care provider about: Any allergies you have. All medicines you are taking, including vitamins, herbs, eye drops, creams, and over-the-counter medicines. Any blood disorders you have. Any surgeries you have had. Any medical conditions you have. Whether you are pregnant or may be pregnant. What are the risks? Generally, this is a safe test. However, problems may occur, including an allergic reaction to dye (contrast) that may be used during the test. What   happens before the test? No specific preparation is needed. You may eat and drink normally. What happens during the test? You will take off your clothes from the waist up and put on a hospital gown. Electrodes or electrocardiogram (ECG)patches may be placed on your  chest. The electrodes or patches are then connected to a device that monitors your heart rate and rhythm. You will lie down on a table for an ultrasound exam. A gel will be applied to your chest to help sound waves pass through your skin. A handheld device, called a transducer, will be pressed against your chest and moved over your heart. The transducer produces sound waves that travel to your heart and bounce back (or "echo" back) to the transducer. These sound waves will be captured in real-time and changed into images of your heart that can be viewed on a video monitor. The images will be recorded on a computer and reviewed by your health care provider. You may be asked to change positions or hold your breath for a short time. This makes it easier to get different views or better views of your heart. In some cases, you may receive contrast through an IV in one of your veins. This can improve the quality of the pictures from your heart. The procedure may vary among health care providers and hospitals.   What can I expect after the test? You may return to your normal, everyday life, including diet, activities, and medicines, unless your health care provider tells you not to do that. Follow these instructions at home: It is up to you to get the results of your test. Ask your health care provider, or the department that is doing the test, when your results will be ready. Keep all follow-up visits. This is important. Summary An echocardiogram is a test that uses sound waves (ultrasound) to produce images of the heart. Images from an echocardiogram can provide important information about the size and shape of your heart, heart muscle function, heart valve function, and other possible heart problems. You do not need to do anything to prepare before this test. You may eat and drink normally. After the echocardiogram is completed, you may return to your normal, everyday life, unless your health care  provider tells you not to do that. This information is not intended to replace advice given to you by your health care provider. Make sure you discuss any questions you have with your health care provider. Document Revised: 09/01/2019 Document Reviewed: 09/01/2019 Elsevier Patient Education  2021 Elsevier Inc.   

## 2020-08-17 NOTE — Progress Notes (Signed)
Cardiology Office Note:    Date:  08/17/2020   ID:  Mercedes Barry, DOB 10-19-1975, MRN [id number]  PCP:  Dois Davenport, MD  Cardiologist:  Garwin Brothers, MD   Referring MD: Dois Davenport, MD    ASSESSMENT:    1. Essential hypertension   2. Turner's syndrome   3. Cardiac murmur    PLAN:    In order of problems listed above:  Primary prevention stressed with the patient.  Importance of compliance with diet medication stressed and she vocalized understanding.  She walks on a regular basis without any symptoms and I encouraged her to continue to be active. Cardiac murmur: Echocardiogram will be done to assess murmur heard on auscultation. Essential hypertension: Blood pressure stable and diet was emphasized.  Lifestyle modification urged. Turner syndrome: Patient request CT of the chest without contrast.  It is known that Turner syndrome patients can have issues with the aorta and vasculature of the chest and abnormalities and I think it is appropriate to get a CT of the chest without contrast to reassure her. Patient will be seen in follow-up appointment in 6 months or earlier if the patient has any concerns    Medication Adjustments/Labs and Tests Ordered: Current medicines are reviewed at length with the patient today.  Concerns regarding medicines are outlined above.  No orders of the defined types were placed in this encounter.  No orders of the defined types were placed in this encounter.    History of Present Illness:    Mercedes Barry is a 45 y.o. female who is being seen today for the evaluation of cardiac evaluation in a patient with Turner syndrome at the request of Hal Hope, Marcos Eke, MD. patient is a pleasant 45 year old female.  She has past medical history of Turner syndrome and requests a cardiac evaluation.  She has history of essential hypertension.  She denies any chest pain orthopnea or PND.  She is an active lady.  She tells me that she walks on  a regular basis without any symptoms.  At the time of my evaluation, the patient is alert awake oriented and in no distress.  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   levothyroxine (SYNTHROID, LEVOTHROID) 112 MCG tablet Take 1 tablet (112 mcg total) by mouth daily before breakfast.   lisinopril (PRINIVIL,ZESTRIL) 10 MG tablet Take 1 tablet by mouth daily.   norethindrone-ethinyl estradiol (AUROVELA FE 1/20) 1-20 MG-MCG tablet Take 1 tablet by mouth daily.     Allergies:   Amoxicillin-pot clavulanate, Cefprozil, Entex, Penicillins, and Cephalosporins   Social History   Socioeconomic History   Marital status: Single    Spouse name: engaged-Robby   Number of children: 0   Years of education: Not on file   Highest education level: Not on file  Occupational History   Occupation: Magazine features editor: [employer]  Tobacco  Use   Smoking status: Never   Smokeless tobacco: Never  Vaping Use   Vaping Use: Never used  Substance and Sexual Activity   Alcohol use: No   Drug use: No   Sexual activity: Not Currently    Partners: Male    Birth control/protection: Pill  Other Topics Concern   Not on file  Social History Narrative   Lives with her fiance, Robby. They plan to marry in 10/2015. Her parents live in [place], [place].   Social Determinants of Health   Financial Resource Strain: Not on file  Food Insecurity: Not on  file  Transportation Needs: Not on file  Physical Activity: Not on file  Stress: Not on file  Social Connections: Not on file     Family History: The patient's family history includes Breast cancer (age of onset: 37) in her maternal grandmother; Breast cancer (age of onset: 62) in her mother; Osteoporosis in her mother.  ROS:   Please see the history of present illness.    All other systems reviewed and are negative.  EKGs/Labs/Other Studies Reviewed:    The following studies were reviewed today: EKG reveals sinus rhythm with nonspecific ST-T changes   Recent Labs: No results found for requested labs within last 8760 hours.  Recent Lipid Panel    Component Value Date/Time   CHOL 165 07/04/2014 0907   TRIG 84 07/04/2014 0907   HDL 79 07/04/2014 0907   CHOLHDL 2.1 07/04/2014 0907   VLDL 17 07/04/2014 0907   LDLCALC 69 07/04/2014 0907    Physical Exam:    VS:  BP 140/82   Pulse 98   Ht 4\' 9"  (1.448 m)   Wt 129 lb 1.3 oz (58.6 kg)   SpO2 99%   BMI 27.93 kg/m     Wt Readings from Last 3 Encounters:  08/17/20 129 lb 1.3 oz (58.6 kg)  12/10/19 140 lb 6.4 oz (63.7 kg)  07/11/18 149 lb (67.6 kg)     GEN: Patient is in no acute distress HEENT: Normal NECK: No JVD; No carotid bruits LYMPHATICS: No lymphadenopathy CARDIAC: S1 S2 regular, 2/6 systolic murmur at the apex. RESPIRATORY:  Clear to auscultation without rales, wheezing or rhonchi  ABDOMEN: Soft, non-tender, non-distended MUSCULOSKELETAL:  No edema; No deformity  SKIN: Warm and dry NEUROLOGIC:  Alert and oriented x 3 PSYCHIATRIC:  Normal affect    Signed, 07/13/18, MD  08/17/2020 11:44 AM    [facility]

## 2020-08-19 ENCOUNTER — Ambulatory Visit (HOSPITAL_BASED_OUTPATIENT_CLINIC_OR_DEPARTMENT_OTHER)
Admission: RE | Admit: 2020-08-19 | Discharge: 2020-08-19 | Disposition: A | Discharge status: Home / Self Care | Payer: BC Managed Care – PPO | Source: Ambulatory Visit | Attending: Cardiology | Admitting: Cardiology

## 2020-08-19 ENCOUNTER — Ambulatory Visit (HOSPITAL_BASED_OUTPATIENT_CLINIC_OR_DEPARTMENT_OTHER): Payer: BC Managed Care – PPO

## 2020-08-19 ENCOUNTER — Other Ambulatory Visit: Payer: Self-pay

## 2020-08-19 DIAGNOSIS — R011 Cardiac murmur, unspecified: Secondary | ICD-10-CM | POA: Insufficient documentation

## 2020-08-19 DIAGNOSIS — Q969 Turner's syndrome, unspecified: Secondary | ICD-10-CM | POA: Diagnosis present

## 2020-08-19 LAB — ECHOCARDIOGRAM COMPLETE
Area-P 1/2: 3.46 cm2
S' Lateral: 2.7 cm

## 2020-08-24 ENCOUNTER — Telehealth: Payer: Self-pay

## 2020-08-24 NOTE — Telephone Encounter (Signed)
-----   Message from Rajan R Revankar, MD sent at 08/24/2020 12:42 PM EDT ----- Significant calcium in the coronary arteries.  Please send the patient for a Chem-7 liver lipid check in the next few days and a Lexiscan sestamibi.  She will need statin therapy once we get the results back please let her know.  Copy primary care. Rajan R Revankar, MD 08/24/2020 12:41 PM  

## 2020-08-24 NOTE — Telephone Encounter (Signed)
Left message on patients voicemail to please return our call.   

## 2020-08-25 ENCOUNTER — Telehealth: Payer: Self-pay

## 2020-08-25 ENCOUNTER — Telehealth: Payer: Self-pay | Admitting: Cardiology

## 2020-08-25 DIAGNOSIS — R931 Abnormal findings on diagnostic imaging of heart and coronary circulation: Secondary | ICD-10-CM

## 2020-08-25 NOTE — Telephone Encounter (Signed)
Pt is returning call from earlier this morning to [place]. Please advise pt further

## 2020-08-25 NOTE — Telephone Encounter (Signed)
-----   Message from Garwin Brothers, MD sent at 08/24/2020 12:42 PM EDT ----- Significant calcium in the coronary arteries.  Please send the patient for a Chem-7 liver lipid check in the next few days and a Lexiscan sestamibi.  She will need statin therapy once we get the results back please let her know.  Copy primary care. Garwin Brothers, MD 08/24/2020 12:41 PM

## 2020-08-25 NOTE — Telephone Encounter (Signed)
Patient called back. Mercedes Barry became really upset on the phone. Mercedes Barry states Mercedes Barry is anxious about having stress test and echo. Again, Mercedes Barry requested a call back from Dr. Tomie China directly for for clarification regarding these tests.

## 2020-08-25 NOTE — Telephone Encounter (Signed)
Patient called and had mor questions for Lequita Halt, or anyone at Dr. Kem Parkinson office. She would just like someone to call her

## 2020-08-25 NOTE — Telephone Encounter (Signed)
Spoke with patient regarding results and recommendation.  Patient verbalizes understanding and is agreeable to plan of care. Advised patient to call back with any issues or concerns.    The following instructions have been reviewed with the patient.    Please arrive 15 minutes prior to your appointment time for registration and insurance purposes.  The test will take approximately 3 to 4 hours to complete; you may bring reading material.  If someone comes with you to your appointment, they will need to remain in the main lobby due to limited space in the testing area. **If you are pregnant or breastfeeding, please notify the nuclear lab prior to your appointment**  How to prepare for your Myocardial Perfusion Test: Do not eat or drink 3 hours prior to your test, except you may have water. Do not consume products containing caffeine (regular or decaffeinated) 12 hours prior to your test. (ex: coffee, chocolate, sodas, tea). Do bring a list of your current medications with you.  If not listed below, you may take your medications as normal. Do wear comfortable clothes (no dresses or overalls) and walking shoes, tennis shoes preferred (No heels or open toe shoes are allowed). Do NOT wear cologne, perfume, aftershave, or lotions (deodorant is allowed). If these instructions are not followed, your test will have to be rescheduled.  If you cannot keep your appointment, please provide 24 hours notification to the Nuclear Lab, to avoid a possible $50 charge to your account.

## 2020-08-25 NOTE — Addendum Note (Signed)
Addended by: Delorse Limber I on: 08/25/2020 10:56 AM   Modules accepted: Orders

## 2020-08-25 NOTE — Telephone Encounter (Signed)
Left message on patients voicemail to please return our call.   

## 2020-08-25 NOTE — Telephone Encounter (Signed)
Spoke with the patient again. She is wondering how soon Mercedes Barry would like for the bubble study echo to be completed. I advised that he would recommend that this be completed at the soonest available appointment. This is in September and she is unhappy with this because she will be starting back to work and is not wanting to take time off of work to go to this test. She wanted to know if it would be ok to wait until December to get this done.   She want's to speak with Mercedes Barry directly about this as she has many questions as to why he even wants the test. I will route to him at this time.

## 2020-08-25 NOTE — Telephone Encounter (Signed)
New Message:      This patient would like to transfer from Dr Tomie China service to Dr Duke Salvia service. She said it is much closer to her house. Is this alright with both of you? t

## 2020-08-26 ENCOUNTER — Other Ambulatory Visit: Payer: Self-pay

## 2020-08-26 DIAGNOSIS — Q969 Turner's syndrome, unspecified: Secondary | ICD-10-CM

## 2020-08-26 DIAGNOSIS — R931 Abnormal findings on diagnostic imaging of heart and coronary circulation: Secondary | ICD-10-CM

## 2020-08-26 DIAGNOSIS — E785 Hyperlipidemia, unspecified: Secondary | ICD-10-CM

## 2020-08-26 NOTE — Progress Notes (Signed)
Ver

## 2020-08-30 ENCOUNTER — Telehealth (HOSPITAL_COMMUNITY): Payer: Self-pay | Admitting: *Deleted

## 2020-08-30 NOTE — Telephone Encounter (Signed)
Close encounter 

## 2020-08-30 NOTE — Addendum Note (Signed)
Addended by: Eleonore Chiquito on: 08/30/2020 08:16 AM   Modules accepted: Orders

## 2020-08-30 NOTE — Addendum Note (Signed)
Addended by: Belva Crome R on: 08/30/2020 08:18 AM   Modules accepted: Orders

## 2020-09-02 ENCOUNTER — Other Ambulatory Visit: Payer: Self-pay

## 2020-09-02 ENCOUNTER — Ambulatory Visit (HOSPITAL_COMMUNITY)
Admission: RE | Admit: 2020-09-02 | Discharge: 2020-09-02 | Disposition: A | Discharge status: Home / Self Care | Payer: BC Managed Care – PPO | Source: Ambulatory Visit | Attending: Cardiology | Admitting: Cardiology

## 2020-09-02 DIAGNOSIS — R931 Abnormal findings on diagnostic imaging of heart and coronary circulation: Secondary | ICD-10-CM | POA: Diagnosis present

## 2020-09-02 LAB — MYOCARDIAL PERFUSION IMAGING
LV dias vol: 69 mL (ref 46–106)
LV sys vol: 25 mL
Peak HR: 134 {beats}/min
Rest HR: 93 {beats}/min
SDS: 3
SRS: 1
SSS: 4
TID: 1.16

## 2020-09-02 MED ORDER — TECHNETIUM TC 99M TETROFOSMIN IV KIT
29.2000 | PACK | Freq: Once | INTRAVENOUS | Status: AC | PRN
  Administered 2020-09-02: 29.2 via INTRAVENOUS
  Filled 2020-09-02: qty 30

## 2020-09-02 MED ORDER — TECHNETIUM TC 99M TETROFOSMIN IV KIT
10.0000 | PACK | Freq: Once | INTRAVENOUS | Status: AC | PRN
  Administered 2020-09-02: 10 via INTRAVENOUS
  Filled 2020-09-02: qty 10

## 2020-09-02 MED ORDER — REGADENOSON 0.4 MG/5ML IV SOLN
0.4000 mg | Freq: Once | INTRAVENOUS | Status: AC
  Administered 2020-09-02: 0.4 mg via INTRAVENOUS

## 2020-09-20 ENCOUNTER — Other Ambulatory Visit (HOSPITAL_COMMUNITY): Payer: Self-pay | Admitting: Cardiology

## 2020-09-20 ENCOUNTER — Ambulatory Visit (HOSPITAL_COMMUNITY): Payer: BC Managed Care – PPO | Attending: Cardiovascular Disease

## 2020-09-20 ENCOUNTER — Other Ambulatory Visit: Payer: Self-pay

## 2020-09-20 ENCOUNTER — Telehealth: Payer: Self-pay | Admitting: Cardiology

## 2020-09-20 DIAGNOSIS — R011 Cardiac murmur, unspecified: Secondary | ICD-10-CM

## 2020-09-20 MED ORDER — SODIUM CHLORIDE 0.9% FLUSH
10.0000 mL | INTRAVENOUS | Status: DC | PRN
  Administered 2020-09-20 (×2): 10 mL via INTRAVENOUS

## 2020-09-20 NOTE — Telephone Encounter (Signed)
Patient returning call for echo results. 

## 2020-09-21 ENCOUNTER — Ambulatory Visit: Payer: BC Managed Care – PPO

## 2020-10-04 ENCOUNTER — Other Ambulatory Visit (HOSPITAL_COMMUNITY): Payer: BC Managed Care – PPO

## 2020-10-06 ENCOUNTER — Other Ambulatory Visit (HOSPITAL_COMMUNITY): Payer: BC Managed Care – PPO

## 2020-11-09 ENCOUNTER — Encounter (HOSPITAL_BASED_OUTPATIENT_CLINIC_OR_DEPARTMENT_OTHER): Payer: Self-pay | Admitting: Cardiovascular Disease

## 2020-11-09 ENCOUNTER — Other Ambulatory Visit: Payer: Self-pay

## 2020-11-09 ENCOUNTER — Ambulatory Visit (HOSPITAL_BASED_OUTPATIENT_CLINIC_OR_DEPARTMENT_OTHER): Payer: BC Managed Care – PPO | Admitting: Cardiovascular Disease

## 2020-11-09 VITALS — BP 120/90 | HR 99 | Ht <= 58 in | Wt 136.1 lb

## 2020-11-09 DIAGNOSIS — Q261 Persistent left superior vena cava: Secondary | ICD-10-CM | POA: Insufficient documentation

## 2020-11-09 DIAGNOSIS — I2510 Atherosclerotic heart disease of native coronary artery without angina pectoris: Secondary | ICD-10-CM | POA: Diagnosis not present

## 2020-11-09 DIAGNOSIS — Q969 Turner's syndrome, unspecified: Secondary | ICD-10-CM | POA: Diagnosis not present

## 2020-11-09 DIAGNOSIS — I10 Essential (primary) hypertension: Secondary | ICD-10-CM | POA: Diagnosis not present

## 2020-11-09 DIAGNOSIS — Z01812 Encounter for preprocedural laboratory examination: Secondary | ICD-10-CM

## 2020-11-09 HISTORY — DX: Atherosclerotic heart disease of native coronary artery without angina pectoris: I25.10

## 2020-11-09 HISTORY — DX: Persistent left superior vena cava: Q26.1

## 2020-11-09 NOTE — Assessment & Plan Note (Signed)
Blood pressure is well have been controlled on lisinopril.  Continue current regimen.  Try to increase exercise to at least 150 minutes weekly.

## 2020-11-09 NOTE — Assessment & Plan Note (Signed)
We will get a coronary CTA to evaluate her aorta ensure that she does not have any aneurysms.  She will need a Z score to normalize for body size.

## 2020-11-09 NOTE — Assessment & Plan Note (Addendum)
She was noted to have coronary calcification on chest CT.  She has no ischemic symptoms.  She had a normal stress test.  Therefore she likely does not have obstructive coronary disease.  We will get repeat fasting lipids and a CMP.  Based on that we will determine her need for statin therapy.  Her LDL goal should be less than 70.  At that time we also discussed starting aspirin 81 mg daily.

## 2020-11-09 NOTE — Assessment & Plan Note (Signed)
Noted on echocardiogram.  It drains into the coronary sinus.  She was not noted to have any other associated congenital heart defects.  No clinical implications.  This was explained to the patient and she expressed understanding.

## 2020-11-09 NOTE — Progress Notes (Signed)
Cardiology Office Note:    Date:  11/09/2020   ID:  Mercedes Barry, DOB 10-28-1975, MRN [id number]  PCP:  Dois Davenport, MD   [facility] Providers Cardiologist:  None     Referring MD: Dois Davenport, MD   No chief complaint on file.   History of Present Illness:    Mercedes Barry is a 45 y.o. female with a hx of hypertension, Turner's syndrome, and hypothyroidism coming in today for follow-up and to establish care with me. She was last seen by cardiology 07/2020. At that time she was feeling well. She was noted to have a murmur and was referred for an echo which revealed LVEF 60-65% and mild LVH. Her diastolic function was normal. She had a non-contrast chest CT which revealed some coronary calcification. She was referred for a nuclear stress test 08/2020 which was low-risk. She had an echo with bubble that revealed a persistent L SVC.   Today, she is feeling fine. Last year, her fiancee passed away, reportedly due to a congenital heart defect, which caused her stress and anxiety.  Ever since, she has been even more attentive about her own cardiovascular health. She does not report any major issues. Although, she has been told she has a heart murmur. 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 However, she does not participate in formal exercise. She denies any palpitations, chest pain, shortness of breath, lightheadedness, headaches, syncope, orthopnea, PND, lower extremity edema, or exertional symptoms.    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   lisinopril (PRINIVIL,ZESTRIL) 10 MG tablet Take 1 tablet by mouth daily.   norethindrone-ethinyl estradiol (AUROVELA FE 1/20) 1-20 MG-MCG tablet Take 1 tablet by mouth daily.     Allergies:   Amoxicillin-pot clavulanate, Cefprozil, Entex, Penicillins, and Cephalosporins   Social History   Socioeconomic History   Marital status: Single    Spouse name: engaged-Robby   Number of children: 0   Years of education: Not on file   Highest education level: Not on file  Occupational History   Occupation: Magazine features editor: [employer]  Tobacco Use   Smoking status: Never   Smokeless tobacco: Never  Vaping Use   Vaping Use: Never used  Substance and Sexual Activity   Alcohol use: No   Drug use: No   Sexual activity: Not Currently    Partners: Male    Birth control/protection: Pill  Other Topics Concern   Not on file  Social History Narrative   Lives with her fiance, Robby. They plan to marry in 10/2015. Her parents live in [place], [place].   Social Determinants of Health   Financial Resource Strain: Not on file  Food Insecurity: Not on file  Transportation Needs:  Not on file  Physical Activity: Not on file  Stress: Not on file  Social Connections: Not on file     Family History: The patient's family history includes Breast cancer (age of onset: 17) in her maternal grandmother; Breast cancer (age of onset: 4) in her mother; Osteoporosis in her mother.  ROS:   Please see the history of present illness.    (+) Stress/Anxiety All other systems reviewed and are negative.  EKGs/Labs/Other Studies Reviewed:    The following studies were reviewed today:  Echo 09/20/2020  1. The  coronary sinus does appear prominant. With the bubble study, the  bubbles did appear in the coronary sinus before the RV which does support the diagnosis of persistent left SVC . The images are somewhat difficult  because of multiple bubble  injections and suboptimal image quality.  Lexiscan 09/02/2020 The left ventricular ejection fraction is normal (55-65%). Nuclear stress EF: 64%. Downsloping ST segment depression ST segment depression was noted during stress in the II, III, aVF, V3, V4 and V5 leads, and returning to baseline after less than 1 minute of recovery. The study is normal. This is a low risk study.   Overall low counts intracardiac with significant extracardiac activity, may limit evaluation. No apparent reversible ischemia. ST changes during infusion of lexiscan.  Echo 08/19/2020 1. ? dilated coronary sinuys consider f/u contrast study with iv in left  arm.   2. Left ventricular ejection fraction, by estimation, is 60 to 65%. The  left ventricle has normal function. The left ventricle has no regional  wall motion abnormalities. There is mild left ventricular hypertrophy.  Left ventricular diastolic parameters  were normal.   3. Right ventricular systolic function is normal. The right ventricular  size is normal.   4. Left atrial size was moderately dilated.   5. Right atrial size was moderately dilated.   6. The mitral valve is normal in structure. No evidence of mitral valve  regurgitation. No evidence of mitral stenosis.   7. The aortic valve is tricuspid. Aortic valve regurgitation is not  visualized. No aortic stenosis is present.   8. The inferior vena cava is normal in size with greater than 50%  respiratory variability, suggesting right atrial pressure of 3 mmHg.   CT Chest 08/19/2020 IMPRESSION: 1. No acute findings. 2. Partially imaged vague low-attenuation lesion in the left hepatic lobe, slightly larger than on 08/16/2015 but characterized as a hemangioma  on that study. 3. Coronary artery calcification.  EKG:   11/09/2020: EKG was not ordered today  Recent Labs: No results found for requested labs within last 8760 hours.  Recent Lipid Panel    Component Value Date/Time   CHOL 165 07/04/2014 0907   TRIG 84 07/04/2014 0907   HDL 79 07/04/2014 0907   CHOLHDL 2.1 07/04/2014 0907   VLDL 17 07/04/2014 0907   LDLCALC 69 07/04/2014 0907     Physical Exam:    VS:  BP 120/90   Pulse 99   Ht 4\' 9"  (1.448 m)   Wt 136 lb 1.6 oz (61.7 kg)   SpO2 99%   BMI 29.45 kg/m  , BMI Body mass index is 29.45 kg/m. GENERAL:  Well appearing HEENT: Pupils equal round and reactive, fundi not visualized, oral mucosa unremarkable NECK:  No jugular venous distention, waveform within normal limits, carotid upstroke brisk and symmetric, no bruits, no thyromegaly LYMPHATICS:  No cervical adenopathy LUNGS:  Clear to auscultation bilaterally HEART:  RRR.  PMI not displaced or  sustained,S1 and S2 within normal limits, no S3, no S4, no clicks, no rubs, no murmurs ABD:  Flat, positive bowel sounds normal in frequency in pitch, no bruits, no rebound, no guarding, no midline pulsatile mass, no hepatomegaly, no splenomegaly EXT:  2 plus pulses throughout, no edema, no cyanosis no clubbing SKIN:  No rashes no nodules NEURO:  Cranial nerves II through XII grossly intact, motor grossly intact throughout PSYCH:  Cognitively intact, oriented to person place and time  ASSESSMENT:    1. Turner's syndrome   2. Essential hypertension   3. Pre-procedure lab exam   4. Primary hypertension   5. Persistent left SVC (superior vena cava)   6. CAD in native artery    PLAN:   Hypertension Blood pressure is well have been controlled on lisinopril.  Continue current regimen.  Try to increase exercise to at least 150 minutes weekly.  Turner's syndrome We will get a coronary CTA to evaluate her aorta ensure that she does not have any aneurysms.  She will need a Z score to  normalize for body size.  Persistent left SVC (superior vena cava) Noted on echocardiogram.  It drains into the coronary sinus.  She was not noted to have any other associated congenital heart defects.  No clinical implications.  This was explained to the patient and she expressed understanding.  CAD in native artery She was noted to have coronary calcification on chest CT.  She has no ischemic symptoms.  She had a normal stress test.  Therefore she likely does not have obstructive coronary disease.  We will get repeat fasting lipids and a CMP.  Based on that we will determine her need for statin therapy.  Her LDL goal should be less than 70.  At that time we also discussed starting aspirin 81 mg daily.    In order of problems listed above:    Medication Adjustments/Labs and Tests Ordered: Current medicines are reviewed at length with the patient today.  Concerns regarding medicines are outlined above.  Orders Placed This Encounter  Procedures   CT ANGIO CHEST AORTA W/CM & OR WO/CM   Basic metabolic panel    No orders of the defined types were placed in this encounter.   Patient Instructions  Medication Instructions:  Your physician recommends that you continue on your current medications as directed. Please refer to the Current Medication list given to you today.   *If you need a refill on your cardiac medications before your next appointment, please call your pharmacy*  Lab Work: BMET 1 WEEK PRIOR TO CTA AORTA   If you have labs (blood work) drawn today and your tests are completely normal, you will receive your results only by: MyChart Message (if you have MyChart) OR A paper copy in the mail If you have any lab test that is abnormal or we need to change your treatment, we will call you to review the results.  Testing/Procedures: Non-Cardiac CT Angiography (CTA), is a special type of CT scan that uses a computer to produce multi-dimensional views of major blood vessels  throughout the body. In CT angiography, a contrast material is injected through an IV to help visualize the blood vessels OF AORTA   Follow-Up: At [facility], you and your health needs are our priority.  As part of our continuing mission to provide you with exceptional heart care, we have created designated Provider Care Teams.  These Care Teams include your primary Cardiologist (physician) and Advanced Practice Providers (APPs -  Physician Assistants and Nurse Practitioners) who all work together to provide you with the care you need, when you need it.  We recommend signing up for the patient portal called "MyChart".  Sign up information is provided on this After Visit Summary.  MyChart is used to connect with patients for Virtual Visits (Telemedicine).  Patients are able to view lab/test results, encounter notes, upcoming appointments, etc.  Non-urgent messages can be sent to your provider as well.   To learn more about what you can do with MyChart, go to [email or web address].    Your next appointment:    03/02/2021 AT 4:20 PM WITH DR [place]    Disposition: FU with Charli Halle C. Duke Salvia, MD, [facility] in 3-4 months   I,Mykaella Javier,acting as a scribe for Chilton Si, MD.,have documented all relevant documentation on the behalf of Chilton Si, MD,as directed by  Chilton Si, MD while in the presence of Chilton Si, MD.  I, Rodolfo Notaro C. Duke Salvia, MD have reviewed all documentation for this visit.  The documentation of the exam, diagnosis, procedures, and orders on 11/09/2020 are all accurate and complete.    Signed, Chilton Si, MD  11/09/2020 7:26 PM    [facility]

## 2020-11-09 NOTE — Patient Instructions (Addendum)
Medication Instructions:  Your physician recommends that you continue on your current medications as directed. Please refer to the Current Medication list given to you today.   *If you need a refill on your cardiac medications before your next appointment, please call your pharmacy*  Lab Work: BMET 1 WEEK PRIOR TO CTA AORTA   If you have labs (blood work) drawn today and your tests are completely normal, you will receive your results only by: MyChart Message (if you have MyChart) OR A paper copy in the mail If you have any lab test that is abnormal or we need to change your treatment, we will call you to review the results.  Testing/Procedures: Non-Cardiac CT Angiography (CTA), is a special type of CT scan that uses a computer to produce multi-dimensional views of major blood vessels throughout the body. In CT angiography, a contrast material is injected through an IV to help visualize the blood vessels OF AORTA   Follow-Up: At [facility], you and your health needs are our priority.  As part of our continuing mission to provide you with exceptional heart care, we have created designated Provider Care Teams.  These Care Teams include your primary Cardiologist (physician) and Advanced Practice Providers (APPs -  Physician Assistants and Nurse Practitioners) who all work together to provide you with the care you need, when you need it.  We recommend signing up for the patient portal called "MyChart".  Sign up information is provided on this After Visit Summary.  MyChart is used to connect with patients for Virtual Visits (Telemedicine).  Patients are able to view lab/test results, encounter notes, upcoming appointments, etc.  Non-urgent messages can be sent to your provider as well.   To learn more about what you can do with MyChart, go to [email or web address].    Your next appointment:    03/02/2021 AT 4:20 PM WITH DR [facility]

## 2020-11-10 ENCOUNTER — Telehealth (HOSPITAL_BASED_OUTPATIENT_CLINIC_OR_DEPARTMENT_OTHER): Payer: Self-pay | Admitting: Cardiovascular Disease

## 2020-11-10 NOTE — Telephone Encounter (Signed)
Left message for patient to call and discuss scheduling the CTA chest/aorta ordered by Dr. [place] 

## 2020-11-15 NOTE — Telephone Encounter (Signed)
Spoke with patient regarding the Tuesday 11/29/20 4:30 pm CTA chest/aorta scheduled at [facility].  Arrival time is 4:45 pm for check in---Patient to have lab work on Tuesday  11/22/20.  She voiced her understanding.

## 2020-11-29 ENCOUNTER — Ambulatory Visit (HOSPITAL_BASED_OUTPATIENT_CLINIC_OR_DEPARTMENT_OTHER): Payer: BC Managed Care – PPO

## 2020-11-29 ENCOUNTER — Telehealth: Payer: Self-pay | Admitting: *Deleted

## 2020-11-29 NOTE — Telephone Encounter (Signed)
-----   Message from Chilton Si, MD sent at 11/09/2020  5:59 PM EDT ----- Regarding: RE: Yes sure. ----- Message ----- From: Burnell Blanks, LPN Sent: [id number]   5:59 PM EDT To: Chilton Si, MD  Hi Dr Duke Salvia   She wanted to know if her follow up could be virtual   Have a great trip   [place]

## 2020-11-29 NOTE — Telephone Encounter (Signed)
Left detailed message, ok per DPR  

## 2020-12-02 ENCOUNTER — Telehealth (HOSPITAL_BASED_OUTPATIENT_CLINIC_OR_DEPARTMENT_OTHER): Payer: Self-pay | Admitting: Cardiovascular Disease

## 2020-12-02 NOTE — Telephone Encounter (Signed)
Left message for patient to call and discuss rescheduling the CTA chest/aorta ordered by Dr. Duke Salvia

## 2020-12-05 NOTE — Telephone Encounter (Signed)
Left message for patient to call and discuss rescheduling the CTA chest/aorta appt that was cancelled 11/29/20

## 2020-12-09 ENCOUNTER — Encounter (HOSPITAL_BASED_OUTPATIENT_CLINIC_OR_DEPARTMENT_OTHER): Payer: Self-pay | Admitting: Cardiovascular Disease

## 2020-12-09 NOTE — Telephone Encounter (Signed)
Left message for patient to call and discuss rescheduling the CTA chest/aorta will also mail letter requesting patient to call

## 2020-12-14 ENCOUNTER — Ambulatory Visit (HOSPITAL_BASED_OUTPATIENT_CLINIC_OR_DEPARTMENT_OTHER): Payer: BC Managed Care – PPO | Admitting: Obstetrics & Gynecology

## 2021-01-02 ENCOUNTER — Telehealth (HOSPITAL_BASED_OUTPATIENT_CLINIC_OR_DEPARTMENT_OTHER): Payer: Self-pay | Admitting: Cardiovascular Disease

## 2021-01-02 DIAGNOSIS — I10 Essential (primary) hypertension: Secondary | ICD-10-CM

## 2021-01-02 DIAGNOSIS — Q969 Turner's syndrome, unspecified: Secondary | ICD-10-CM

## 2021-01-02 NOTE — Telephone Encounter (Signed)
Patient calling to get CT reschedule but the order is showing cancel. Please advise

## 2021-01-03 NOTE — Telephone Encounter (Signed)
Orders placed and message to Donah Driver to schedule

## 2021-01-03 NOTE — Telephone Encounter (Signed)
Pt. Needs to be rescheduled for Ct, looks like she had an appointment but cancelled and the order got cancelled as well

## 2021-01-04 NOTE — Telephone Encounter (Signed)
Left message for patient to call and discuss rescheduling CTA chest/aorta

## 2021-01-05 NOTE — Telephone Encounter (Signed)
Left message for patient to call and discuss rescheduling the CTA chest/aorta ordered by Dr. [place] 

## 2021-01-09 NOTE — Telephone Encounter (Signed)
Patient is returning call to schedule CT. I was unable to reach scheduler for transfer. Please return call when able.

## 2021-01-09 NOTE — Telephone Encounter (Signed)
Spoke with patient regarding the Thursday 01/19/21 9:00 am CTA chest/aorta appointment at [facility]----[address]--ground floor--patient to arrive at 8:30 am for check in --Liquids only 4 hours prior to study.  Patient to have lab work done this week.  She voiced her understanding.

## 2021-01-09 NOTE — Telephone Encounter (Signed)
Called to speak with patient regarding rescheduling the CTA chest/aorta appointment---lm for patient to call

## 2021-01-09 NOTE — Telephone Encounter (Signed)
Can you please help this patient get scheduled for her CT please?

## 2021-01-10 ENCOUNTER — Ambulatory Visit
Admission: RE | Admit: 2021-01-10 | Discharge: 2021-01-10 | Disposition: A | Discharge status: Home / Self Care | Payer: BC Managed Care – PPO | Source: Ambulatory Visit | Attending: Family Medicine | Admitting: Family Medicine

## 2021-01-10 DIAGNOSIS — Z1231 Encounter for screening mammogram for malignant neoplasm of breast: Secondary | ICD-10-CM

## 2021-01-12 ENCOUNTER — Ambulatory Visit (HOSPITAL_BASED_OUTPATIENT_CLINIC_OR_DEPARTMENT_OTHER): Payer: BC Managed Care – PPO | Admitting: Obstetrics & Gynecology

## 2021-01-12 LAB — BASIC METABOLIC PANEL
BUN/Creatinine Ratio: 20 (ref 9–23)
BUN: 13 mg/dL (ref 6–24)
CO2: 23 mmol/L (ref 20–29)
Calcium: 9.4 mg/dL (ref 8.7–10.2)
Chloride: 99 mmol/L (ref 96–106)
Creatinine, Ser: 0.65 mg/dL (ref 0.57–1.00)
Glucose: 157 mg/dL — ABNORMAL HIGH (ref 70–99)
Potassium: 4.7 mmol/L (ref 3.5–5.2)
Sodium: 139 mmol/L (ref 134–144)
eGFR: 111 mL/min/{1.73_m2} (ref >59)

## 2021-01-18 ENCOUNTER — Ambulatory Visit
Admission: RE | Admit: 2021-01-18 | Discharge: 2021-01-18 | Disposition: A | Discharge status: Home / Self Care | Payer: BC Managed Care – PPO | Source: Ambulatory Visit | Attending: Family Medicine | Admitting: Family Medicine

## 2021-01-18 DIAGNOSIS — M810 Age-related osteoporosis without current pathological fracture: Secondary | ICD-10-CM

## 2021-01-19 ENCOUNTER — Other Ambulatory Visit: Payer: Self-pay

## 2021-01-19 ENCOUNTER — Ambulatory Visit (HOSPITAL_BASED_OUTPATIENT_CLINIC_OR_DEPARTMENT_OTHER)
Admission: RE | Admit: 2021-01-19 | Discharge: 2021-01-19 | Disposition: A | Discharge status: Home / Self Care | Payer: BC Managed Care – PPO | Source: Ambulatory Visit | Attending: Cardiovascular Disease | Admitting: Cardiovascular Disease

## 2021-01-19 ENCOUNTER — Encounter (HOSPITAL_BASED_OUTPATIENT_CLINIC_OR_DEPARTMENT_OTHER): Payer: Self-pay

## 2021-01-19 DIAGNOSIS — I10 Essential (primary) hypertension: Secondary | ICD-10-CM | POA: Diagnosis present

## 2021-01-19 DIAGNOSIS — Q969 Turner's syndrome, unspecified: Secondary | ICD-10-CM | POA: Diagnosis not present

## 2021-01-19 MED ORDER — IOHEXOL 350 MG/ML SOLN
75.0000 mL | Freq: Once | INTRAVENOUS | Status: AC | PRN
  Administered 2021-01-19: 09:00:00 75 mL via INTRAVENOUS

## 2021-01-30 ENCOUNTER — Other Ambulatory Visit: Payer: Self-pay

## 2021-01-30 ENCOUNTER — Encounter (HOSPITAL_BASED_OUTPATIENT_CLINIC_OR_DEPARTMENT_OTHER): Payer: Self-pay

## 2021-01-30 ENCOUNTER — Emergency Department (HOSPITAL_BASED_OUTPATIENT_CLINIC_OR_DEPARTMENT_OTHER)
Admission: EM | Admit: 2021-01-30 | Discharge: 2021-01-30 | Disposition: A | Discharge status: Home / Self Care | Payer: BC Managed Care – PPO | Attending: Emergency Medicine | Admitting: Emergency Medicine

## 2021-01-30 ENCOUNTER — Emergency Department (HOSPITAL_BASED_OUTPATIENT_CLINIC_OR_DEPARTMENT_OTHER): Payer: BC Managed Care – PPO

## 2021-01-30 DIAGNOSIS — D72829 Elevated white blood cell count, unspecified: Secondary | ICD-10-CM | POA: Diagnosis not present

## 2021-01-30 DIAGNOSIS — R197 Diarrhea, unspecified: Secondary | ICD-10-CM | POA: Insufficient documentation

## 2021-01-30 DIAGNOSIS — R1084 Generalized abdominal pain: Secondary | ICD-10-CM | POA: Diagnosis present

## 2021-01-30 DIAGNOSIS — Z79899 Other long term (current) drug therapy: Secondary | ICD-10-CM | POA: Diagnosis not present

## 2021-01-30 DIAGNOSIS — R7309 Other abnormal glucose: Secondary | ICD-10-CM | POA: Diagnosis not present

## 2021-01-30 DIAGNOSIS — R5383 Other fatigue: Secondary | ICD-10-CM | POA: Diagnosis not present

## 2021-01-30 DIAGNOSIS — R779 Abnormality of plasma protein, unspecified: Secondary | ICD-10-CM | POA: Diagnosis not present

## 2021-01-30 DIAGNOSIS — R112 Nausea with vomiting, unspecified: Secondary | ICD-10-CM | POA: Insufficient documentation

## 2021-01-30 DIAGNOSIS — E039 Hypothyroidism, unspecified: Secondary | ICD-10-CM | POA: Insufficient documentation

## 2021-01-30 DIAGNOSIS — I10 Essential (primary) hypertension: Secondary | ICD-10-CM | POA: Insufficient documentation

## 2021-01-30 DIAGNOSIS — R000 Tachycardia, unspecified: Secondary | ICD-10-CM | POA: Diagnosis not present

## 2021-01-30 LAB — URINALYSIS, ROUTINE W REFLEX MICROSCOPIC
Bilirubin Urine: NEGATIVE
Glucose, UA: NEGATIVE mg/dL
Hgb urine dipstick: NEGATIVE
Ketones, ur: NEGATIVE mg/dL
Leukocytes,Ua: NEGATIVE
Nitrite: NEGATIVE
Protein, ur: 30 mg/dL — AB
Specific Gravity, Urine: >1.046 — ABNORMAL HIGH (ref 1.005–1.030)
pH: 7 (ref 5.0–8.0)

## 2021-01-30 LAB — CBC
HCT: 41.1 % (ref 36.0–46.0)
Hemoglobin: 13.3 g/dL (ref 12.0–15.0)
MCH: 29.8 pg (ref 26.0–34.0)
MCHC: 32.4 g/dL (ref 30.0–36.0)
MCV: 91.9 fL (ref 80.0–100.0)
Platelets: 315 10*3/uL (ref 150–400)
RBC: 4.47 MIL/uL (ref 3.87–5.11)
RDW: 13.2 % (ref 11.5–15.5)
WBC: 13.3 10*3/uL — ABNORMAL HIGH (ref 4.0–10.5)
nRBC: 0 % (ref 0.0–0.2)

## 2021-01-30 LAB — COMPREHENSIVE METABOLIC PANEL
ALT: 15 U/L (ref 0–44)
AST: 15 U/L (ref 15–41)
Albumin: 3.8 g/dL (ref 3.5–5.0)
Alkaline Phosphatase: 51 U/L (ref 38–126)
Anion gap: 12 (ref 5–15)
BUN: 13 mg/dL (ref 6–20)
CO2: 27 mmol/L (ref 22–32)
Calcium: 8.6 mg/dL — ABNORMAL LOW (ref 8.9–10.3)
Chloride: 98 mmol/L (ref 98–111)
Creatinine, Ser: 0.6 mg/dL (ref 0.44–1.00)
GFR, Estimated: >60 mL/min (ref >60)
Glucose, Bld: 132 mg/dL — ABNORMAL HIGH (ref 70–99)
Potassium: 4.2 mmol/L (ref 3.5–5.1)
Sodium: 137 mmol/L (ref 135–145)
Total Bilirubin: 0.7 mg/dL (ref 0.3–1.2)
Total Protein: 6.3 g/dL — ABNORMAL LOW (ref 6.5–8.1)

## 2021-01-30 LAB — PREGNANCY, URINE: Preg Test, Ur: NEGATIVE

## 2021-01-30 LAB — LIPASE, BLOOD: Lipase: <10 U/L — ABNORMAL LOW (ref 11–51)

## 2021-01-30 MED ORDER — IOHEXOL 300 MG/ML  SOLN
75.0000 mL | Freq: Once | INTRAMUSCULAR | Status: AC | PRN
  Administered 2021-01-30: 75 mL via INTRAVENOUS

## 2021-01-30 MED ORDER — ONDANSETRON HCL 4 MG/2ML IJ SOLN
4.0000 mg | Freq: Once | INTRAMUSCULAR | Status: AC
  Administered 2021-01-30: 4 mg via INTRAVENOUS
  Filled 2021-01-30: qty 2

## 2021-01-30 MED ORDER — LACTATED RINGERS IV BOLUS
1000.0000 mL | Freq: Once | INTRAVENOUS | Status: AC
  Administered 2021-01-30: 1000 mL via INTRAVENOUS

## 2021-01-30 MED ORDER — ONDANSETRON HCL 4 MG PO TABS: 4.0000 mg | ORAL_TABLET | Freq: Four times a day (QID) | ORAL | 0 refills | Status: DC

## 2021-01-30 NOTE — ED Notes (Signed)
Patient tolerated PO challenge, denies NV when asked.

## 2021-01-30 NOTE — ED Provider Notes (Signed)
[facility] Provider Note   CSN: [id number] Arrival date & time: 01/30/21  1113     History  Chief Complaint  Patient presents with   Abdominal Pain    Mercedes Barry is a 46 y.o. female with history of Turner syndrome presents emergency department for generalized dull abdominal pain since yesterday that is been gradually improving.  She reports she has been fatigued with mild nausea and 3 episodes of vomiting.  She denies any vomiting today.  Additionally she mentions some watery stools with 3 episodes a day and 5-10 episodes yesterday.  She has been flatulent as well.  She denies any fever, chest pain, shortness of breath.  Denies any cough or cold symptoms. Denies any vaginal or urinary symptoms. She has had recent antibiotic use of clindamycin to doxycycline last month for recurrent sinusitis.  She reports she does have a history of C. difficile before June 2011 when she was concerned with that.  She denies any dark or tarriness to her stool.  She denies any hematemesis, hematochezia. She reports she hasn't had an appetite but has been able to keep down some fluids.  Medical history includes hypothyroidism and hypertension.  No pertinent surgery history.  Daily medications include Synthroid, lisinopril, and OCP.  Surgery to penicillins.  Denies any tobacco, EtOH, illicit drug use ever.   Abdominal Pain Associated symptoms: diarrhea, fatigue, nausea and vomiting   Associated symptoms: no chest pain, no chills, no cough, no dysuria, no fever, no hematuria, no shortness of breath, no sore throat, no vaginal bleeding and no vaginal discharge       Home Medications Prior to Admission medications   Medication Sig Start Date End Date Taking? Authorizing Provider  ondansetron (ZOFRAN) 4 MG tablet Take 1 tablet (4 mg total) by mouth every 6 (six) hours. 01/30/21  Yes Sherrell Puller, PA-C  Cholecalciferol (VITAMIN D3) 20 MCG (800 UNIT) TABS Take 800 Units by mouth  daily. Patient not taking: Reported on 11/09/2020    [provider]  levothyroxine (SYNTHROID, LEVOTHROID) 112 MCG tablet Take 1 tablet (112 mcg total) by mouth daily before breakfast. 07/06/14   Shawnee Knapp, MD  lisinopril (PRINIVIL,ZESTRIL) 10 MG tablet Take 1 tablet by mouth daily. 01/21/17   [provider]  norethindrone-ethinyl estradiol (AUROVELA FE 1/20) 1-20 MG-MCG tablet Take 1 tablet by mouth daily. 01/28/20   Karma Ganja, NP      Allergies    Amoxicillin-pot clavulanate, Cefprozil, Entex, Penicillins, and Cephalosporins    Review of Systems   Review of Systems  Constitutional:  Positive for fatigue. Negative for chills and fever.  HENT:  Negative for ear pain and sore throat.   Eyes:  Negative for pain and visual disturbance.  Respiratory:  Negative for cough and shortness of breath.   Cardiovascular:  Negative for chest pain and palpitations.  Gastrointestinal:  Positive for abdominal pain, diarrhea, nausea and vomiting. Negative for abdominal distention, blood in stool and rectal pain.  Genitourinary:  Negative for dysuria, frequency, hematuria, urgency, vaginal bleeding and vaginal discharge.  Musculoskeletal:  Negative for arthralgias and back pain.  Skin:  Negative for color change and rash.  Neurological:  Negative for seizures and syncope.  All other systems reviewed and are negative.  Physical Exam Updated Vital Signs BP 137/80 (BP Location: Right Arm)    Pulse (!) 105    Temp 97.9 F (36.6 C)    Resp 16    Ht 4\' 9"  (1.448 m)  Wt 61.7 kg    SpO2 96%    BMI 29.44 kg/m  Physical Exam Vitals and nursing note reviewed.  Constitutional:      General: She is not in acute distress.    Appearance: Normal appearance. She is not toxic-appearing.  HENT:     Head: Normocephalic and atraumatic.     Mouth/Throat:     Mouth: Mucous membranes are moist.  Eyes:     General: No scleral icterus. Cardiovascular:     Rate and Rhythm: Regular rhythm.  Tachycardia present.  Pulmonary:     Effort: Pulmonary effort is normal. No respiratory distress.     Breath sounds: Normal breath sounds.  Abdominal:     General: Abdomen is flat. Bowel sounds are increased. There is no distension.     Palpations: Abdomen is soft. There is no mass.     Tenderness: There is generalized abdominal tenderness. There is no right CVA tenderness, left CVA tenderness, guarding or rebound. Negative signs include Murphy's sign and Rovsing's sign.  Genitourinary:    Comments: Exam deferred Musculoskeletal:        General: No deformity.     Cervical back: Normal range of motion.  Skin:    General: Skin is warm and dry.     Comments: No decrease in skin turgor  Neurological:     General: No focal deficit present.     Mental Status: She is alert. Mental status is at baseline.    ED Results / Procedures / Treatments   Labs (all labs ordered are listed, but only abnormal results are displayed) Labs Reviewed  LIPASE, BLOOD - Abnormal; Notable for the following components:      Result Value   Lipase <10 (*)    All other components within normal limits  COMPREHENSIVE METABOLIC PANEL - Abnormal; Notable for the following components:   Glucose, Bld 132 (*)    Calcium 8.6 (*)    Total Protein 6.3 (*)    All other components within normal limits  CBC - Abnormal; Notable for the following components:   WBC 13.3 (*)    All other components within normal limits  URINALYSIS, ROUTINE W REFLEX MICROSCOPIC - Abnormal; Notable for the following components:   Specific Gravity, Urine >1.046 (*)    Protein, ur 30 (*)    All other components within normal limits  PREGNANCY, URINE    EKG None  Radiology CT ABDOMEN PELVIS W CONTRAST  Result Date: 01/30/2021 CLINICAL DATA:  Mid abdominal pain, nausea vomiting and diarrhea EXAM: CT ABDOMEN AND PELVIS WITH CONTRAST TECHNIQUE: Multidetector CT imaging of the abdomen and pelvis was performed using the standard protocol  following bolus administration of intravenous contrast. CONTRAST:  72mL OMNIPAQUE IOHEXOL 300 MG/ML  SOLN COMPARISON:  CT abdomen and pelvis dated August 16, 2015 FINDINGS: Lower chest: Bibasilar atelectasis. Hepatobiliary: Low-attenuation lesion of the left lobe of the liver with some areas of peripheral nodular enhancement measuring 4.7 x 3 5 cm, has been present on multiple prior exams and is likely a benign hemangioma. No suspicious liver lesions. Gallbladder is unremarkable. No biliary ductal dilation. Pancreas: Unremarkable. No pancreatic ductal dilatation or surrounding inflammatory changes. Spleen: Normal in size without focal abnormality. Adrenals/Urinary Tract: Bilateral adrenal glands are unremarkable. Kidneys enhance symmetrically no evidence hydronephrosis or nephrolithiasis. Bladder is unremarkable. Stomach/Bowel: Stomach is within normal limits. Appendix appears normal. Submucosal fat deposition of the colon, similar to prior exam. No evidence of bowel wall thickening, distention, or inflammatory changes. Vascular/Lymphatic: No  significant vascular findings are present. No enlarged abdominal or pelvic lymph nodes. Reproductive: Uterus and bilateral adnexa are unremarkable. Other: No abdominal wall hernia or abnormality. No abdominopelvic ascites. Musculoskeletal: No acute or significant osseous findings. IMPRESSION: No acute findings in the abdomen or pelvis. Electronically Signed   By: Yetta Glassman M.D.   On: 01/30/2021 15:02    Procedures Procedures  Mild tachycardia, otherwise unremarkable.   Medications Ordered in ED Medications  lactated ringers bolus 1,000 mL (0 mLs Intravenous Stopped 01/30/21 1834)  ondansetron (ZOFRAN) injection 4 mg (4 mg Intravenous Given 01/30/21 1320)  iohexol (OMNIPAQUE) 300 MG/ML solution 75 mL (75 mLs Intravenous Contrast Given 01/30/21 1437)    ED Course/ Medical Decision Making/ A&P                           Medical Decision Making  46 year old female  presents emergency department for evaluation of abdominal pain, nausea, vomiting, diarrhea since yesterday and has been gradually improving.  Differential diagnosis includes but is not limited to viral gastroenteritis, infectious gastroenteritis, cholecystitis, cholelithiasis, choledocholithiasis, cholangitis, small bowel obstruction, C. difficile, diverticulitis, colitis.  Vital signs show tachycardia, otherwise unremarkable.  Physical exam shows a mild tenderness palpation in the generalized abdomen.  Hyperactive bowel sounds.  No guarding or rebound.  Negative Rovsing's.  Negative Murphy's.  No CVA tenderness.  Soft abdomen.  Nonsurgical.  Moist mucous membranes.  Patient declined pain medicine at this time.  Zofran and LR 1L bolus.   Labs show less than 10 lipase.  CBC shows mildly elevated white blood cell count at 13.3, no anemia.  Pregnancy test negative.  CMP shows mildly elevated glucose at 132, mildly decreased calcium, mildly decreased protein.  Normal LFTs, no Electra abnormalities.  Urinalysis shows concentrated urine with some protein.  The patient was unable to produce a bowel movement while here so a C. difficile PCR was not able to be obtained.  CT of abdomen showed no acute finding in the abdomen or pelvis.   P.o. challenge initiated.  Was able to keep down soda, water, and crackers without emesis.  Abdominal exam unchanged.  Patient reports she is feeling better with the Zofran and is having hunger pains now.  With the reassuring lab work slightly elevated white blood cell count may be due to a viral gastroenteritis or her vomiting, no acute abdominal etiology seen CT scan.  Physical exam reassuring with a nonsurgical abdomen.  Her tachycardia has improved with a bag of fluids.  Likely due to dehydration with a positive urine as well.  Recommended the patient drink plenty of fluids, mainly water as p.o. fluids are better than IV fluids.  Strict return precautions discussed with the patient.   Recommended to follow-up with her PCP for a stool sample if she is concerned for C. difficile also.  Sent home on Zofran.  Patient agrees to plan.  Patient is stable being discharged home in good condition.  Final Clinical Impression(s) / ED Diagnoses Final diagnoses:  Generalized abdominal pain  Nausea and vomiting, unspecified vomiting type    Rx / DC Orders ED Discharge Orders          Ordered    ondansetron (ZOFRAN) 4 MG tablet  Every 6 hours        01/30/21 1813              Sherrell Puller, PA-C 02/01/21 1328    Long, Wonda Old[facility], MD 02/01/21 1704

## 2021-01-30 NOTE — ED Triage Notes (Signed)
Patient here POV from Home with ABD Pain.  Patient states Pain is Mid-ABD and Non-Radiating. Pain has been present for approximately 24 hours. Diarrhea yesterday. No Fevers. Moderate Nausea/Vomiting.   Patient does endorse recent use of Antibiotics for Sinus Infection.  Patient believes it is C-Diff due to History of Same.  NAD Noted during Triage. A&Ox4. GCS 15. Ambulatory.

## 2021-01-30 NOTE — Discharge Instructions (Addendum)
You are seen here today for evaluation of your abdominal pain, nausea, and vomiting.  There was nothing acutely seen on your abdominal CT scan.  Your lab results show some dehydration.  Please make sure you drink plenty of fluids, mainly water.  Information on the BRAT diet included.  Is unsure not whether you have C. difficile or not, but you should follow-up with your PCP or gastroenterologist and provide them a stool sample soon.  I prescribed you Zofran to take at home as needed for nausea vomiting.  If you have any concern, new or worsening symptoms, please return to the nearest emergency department for evaluation.

## 2021-02-16 ENCOUNTER — Telehealth (HOSPITAL_BASED_OUTPATIENT_CLINIC_OR_DEPARTMENT_OTHER): Payer: Self-pay | Admitting: *Deleted

## 2021-02-16 DIAGNOSIS — Q969 Turner's syndrome, unspecified: Secondary | ICD-10-CM

## 2021-02-16 DIAGNOSIS — I10 Essential (primary) hypertension: Secondary | ICD-10-CM

## 2021-02-16 DIAGNOSIS — Q278 Other specified congenital malformations of peripheral vascular system: Secondary | ICD-10-CM

## 2021-02-16 NOTE — Telephone Encounter (Signed)
-----   Message from Chilton Si, MD sent at 02/11/2021  2:00 PM EST ----- Study shows that there is no aortic aneurysm.  There is some dilatation of the artery that supplies blood to the left arm.  Repeat CTA in 6 months to make sure that it is stable.  It also showed some plaque in her coronary arteries which we already knew about.  Come back for fasting lipids and a CMP so we can determine whether she needs to be on something for her cholesterol.

## 2021-02-16 NOTE — Telephone Encounter (Signed)
Advised patient of lab results, mailed lab orders  Message to scheduling to arrange

## 2021-03-02 ENCOUNTER — Encounter (HOSPITAL_BASED_OUTPATIENT_CLINIC_OR_DEPARTMENT_OTHER): Payer: Self-pay

## 2021-03-02 ENCOUNTER — Telehealth (INDEPENDENT_AMBULATORY_CARE_PROVIDER_SITE_OTHER): Payer: BC Managed Care – PPO | Admitting: Cardiovascular Disease

## 2021-03-02 ENCOUNTER — Encounter (HOSPITAL_BASED_OUTPATIENT_CLINIC_OR_DEPARTMENT_OTHER): Payer: Self-pay | Admitting: Cardiovascular Disease

## 2021-03-02 DIAGNOSIS — I728 Aneurysm of other specified arteries: Secondary | ICD-10-CM

## 2021-03-02 DIAGNOSIS — I10 Essential (primary) hypertension: Secondary | ICD-10-CM | POA: Diagnosis not present

## 2021-03-02 DIAGNOSIS — I2510 Atherosclerotic heart disease of native coronary artery without angina pectoris: Secondary | ICD-10-CM

## 2021-03-02 HISTORY — DX: Aneurysm of other specified arteries: I72.8

## 2021-03-02 NOTE — Assessment & Plan Note (Signed)
She has plaque in her LAD on her chest CT.  Her LDL goal should be less than 70.  She will come for fasting lipids and a CMP.  I suspect she will need a statin.  She is also going to work on exercise and her diet.

## 2021-03-02 NOTE — Assessment & Plan Note (Addendum)
Blood pressure has been controlled.  She is going to work on increasing her exercise and weight loss.  Continue lisinopril.

## 2021-03-02 NOTE — Assessment & Plan Note (Signed)
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  It was 1.7 cm.  Repeat in 6 months.

## 2021-03-02 NOTE — Progress Notes (Signed)
Virtual Visit via Video Note   This visit type was conducted due to national recommendations for restrictions regarding the COVID-19 Pandemic (e.g. social distancing) in an effort to limit this patient's exposure and mitigate transmission in our community. Due to her co-morbid illnesses, this patient is at least at moderate risk for complications without adequate follow up. This format is felt to be most appropriate for this patient at this time. All issues noted in this document were discussed and addressed. A limited physical exam was performed with this format. Please refer to the patient's chart for her consent to telehealth for [facility].  The patient was identified using 2 identifiers.  Date:  03/02/2021   ID:  Mercedes Barry, DOB 12-18-1975, MRN [id number]  Patient Location: Home Provider Location: Office/Clinic  Visit Type: Video  PCP:  Mercedes Davenport, MD  Cardiologist:  None  Electrophysiologist:  None   Evaluation Performed:  Follow-Up Visit  History of Present Illness:   Mercedes Barry is a 46 y.o. female with a hx of hypertension, Turner's syndrome, and hypothyroidism coming in today for follow-up. She was noted to have a murmur and was referred for an echo which revealed LVEF 60-65% and mild LVH. Her diastolic function was normal. She had a non-contrast chest CT which revealed some coronary calcification. She was referred for a nuclear stress test 08/2020 which was low-risk. She had an echo with bubble that revealed a persistent L SVC.   At her last appointment, she was doing fine. Her fiancee passed away due to a congenital heart defect which caused her stress and she was told she had a heart murmur. Coronary CTA 12/2020 showed moderate coronary atherosclerosis. She did not have an aortic aneurysm. However, it did show L subclavian artery dilation to 1.7 cm and significant atherosclerosis in the LAD artery.  Today, she is doing well. She had a sinus and stomach  infection recently. However, the infection resolved. We discussed the results of her coronary CTA. 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 She does not formally exercise but walks throughout her work day and is trying to lose weight. For breakfast, she has a protein shake. For her other meals, she eats grilled [place], vegetables, and fish. She denies any palpitations, chest pain, or shortness of breath, lightheadedness, headaches, syncope, orthopnea, PND, lower extremity edema or exertional symptoms.  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   lisinopril (PRINIVIL,ZESTRIL) 10 MG tablet Take 1 tablet by mouth daily.   norethindrone-ethinyl estradiol (AUROVELA FE 1/20) 1-20 MG-MCG tablet Take 1 tablet by mouth daily.     Allergies:   Amoxicillin-pot clavulanate, Cefprozil, Entex, Penicillins, and Cephalosporins   Social History   Socioeconomic History   Marital status: Single  Spouse name: engaged-Robby   Number of children: 0   Years of education: Not on file   Highest education level: Not on file  Occupational History   Occupation: Product manager: [employer]  Tobacco Use   Smoking status: Never   Smokeless tobacco: Never  Vaping Use    Vaping Use: Never used  Substance and Sexual Activity   Alcohol use: No   Drug use: No   Sexual activity: Not Currently    Partners: Male    Birth control/protection: Pill  Other Topics Concern   Not on file  Social History Narrative   Lives with her fiance, Robby. They plan to marry in 10/2015. Her parents live in [place], [place].   Social Determinants of Health   Financial Resource Strain: Not on file  Food Insecurity: Not on file  Transportation Needs: Not on file  Physical Activity: Not on file  Stress: Not on file  Social Connections: Not on file     Family History: The patient's family history includes Breast cancer (age of onset: 71) in her maternal grandmother; Breast cancer (age of onset: 42) in her mother; Osteoporosis in her mother.  ROS:   Please see the history of present illness.    All other systems reviewed and are negative.  EKGs/Labs/Other Studies Reviewed:    The following studies were reviewed today: CTA Chest 01/19/2021 1. No CTA evidence of thoracic aortic aneurysm or dissection. 2. Moderate burden of coronary atherosclerosis, greatest burden within the LAD. Findings are greater than expected in a patient of this age. 3. Ectatic origin of the LEFT subclavian artery, measuring 1.7 cm. Attention on follow-up.  Echo 09/20/2020  1. The coronary sinus does appear prominant. With the bubble study, the  bubbles did appear in the coronary sinus before the RV which does support the diagnosis of persistent left SVC . The images are somewhat difficult  because of multiple bubble  injections and suboptimal image quality.  Lexiscan 09/02/2020 The left ventricular ejection fraction is normal (55-65%). Nuclear stress EF: 64%. Downsloping ST segment depression ST segment depression was noted during stress in the II, III, aVF, V3, V4 and V5 leads, and returning to baseline after less than 1 minute of recovery. The study is normal. This is a low risk study.    Overall low counts intracardiac with significant extracardiac activity, may limit evaluation. No apparent reversible ischemia. ST changes during infusion of lexiscan.  Echo 08/19/2020 1. ? dilated coronary sinuys consider f/u contrast study with iv in left  arm.   2. Left ventricular ejection fraction, by estimation, is 60 to 65%. The  left ventricle has normal function. The left ventricle has no regional  wall motion abnormalities. There is mild left ventricular hypertrophy.  Left ventricular diastolic parameters  were normal.   3. Right ventricular systolic function is normal. The right ventricular  size is normal.   4. Left atrial size was moderately dilated.   5. Right atrial size was moderately dilated.   6. The mitral valve is normal in structure. No evidence of mitral valve  regurgitation. No evidence of mitral stenosis.   7. The aortic valve is tricuspid. Aortic valve regurgitation is not  visualized. No aortic stenosis is present.   8. The inferior vena cava is normal in size with greater than 50%  respiratory variability, suggesting right atrial pressure of 3 mmHg.   CT Chest 08/19/2020 IMPRESSION: 1. No acute findings. 2. Partially imaged vague low-attenuation lesion in  the left hepatic lobe, slightly larger than on 08/16/2015 but characterized as a hemangioma on that study. 3. Coronary artery calcification.  EKG:  EKG was not ordered today  Recent Labs: 01/30/2021: ALT 15; BUN 13; Creatinine, Ser 0.60; Hemoglobin 13.3; Platelets 315; Potassium 4.2; Sodium 137  Recent Lipid Panel    Component Value Date/Time   CHOL 165 07/04/2014 0907   TRIG 84 07/04/2014 0907   HDL 79 07/04/2014 0907   CHOLHDL 2.1 07/04/2014 0907   VLDL 17 07/04/2014 0907   LDLCALC 69 07/04/2014 0907     Physical Exam:    Ht 4\' 9"  (1.448 m)    Wt 145 lb (65.8 kg)    BMI 31.38 kg/m  GENERAL: Well-appearing.  No acute distress. HEENT: Pupils equal round.  Oral mucosa unremarkable NECK:  No  jugular venous distention, no visible thyromegaly EXT:  No edema, no cyanosis no clubbing SKIN:  No rashes no nodules NEURO:  Speech fluent.  Cranial nerves grossly intact.  Moves all 4 extremities freely PSYCH:  Cognitively intact, oriented to person place and time  ASSESSMENT:    1. Essential hypertension   2. CAD in native artery   3. Subclavian aneurysm (HCC)     PLAN:   Essential hypertension Blood pressure has been controlled.  She is going to work on increasing her exercise and weight loss.  Continue lisinopril.  CAD in native artery She has plaque in her LAD on her chest CT.  Her LDL goal should be less than 70.  She will come for fasting lipids and a CMP.  I suspect she will need a statin.  She is also going to work on exercise and her diet.  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  It was 1.7 cm.  Repeat in 6 months.   In order of problems listed above:    Medication Adjustments/Labs and Tests Ordered: Current medicines are reviewed at length with the patient today.  Concerns regarding medicines are outlined above.  No orders of the defined types were placed in this encounter.   No orders of the defined types were placed in this encounter.    Patient Instructions  Medication Instructions:  Your physician recommends that you continue on your current medications as directed. Please refer to the Current Medication list given to you today.   *If you need a refill on your cardiac medications before your next appointment, please call your pharmacy*   Lab Work: FASTING LP/CMET SOON  If you have labs (blood work) drawn today and your tests are completely normal, you will receive your results only by: [place] (if you have MyChart) OR A paper copy in the mail If you have any lab test that is abnormal or we need to change your treatment, we will call you to review the results.   Testing/Procedures: CT IN ABOUT 6 MONTHS     Follow-Up: At [facility], you and your health needs are our priority.  As part of our continuing mission to provide you with exceptional heart care, we have created designated Provider Care Teams.  These Care Teams include your primary Cardiologist (physician) and Advanced Practice Providers (APPs -  Physician Assistants and Nurse Practitioners) who all work together to provide you with the care you need, when you need it.  We recommend signing up for the patient portal called "MyChart".  Sign up information is provided on this After Visit Summary.  MyChart is used to connect with patients for  Virtual Visits (Telemedicine).  Patients are able to view lab/test results, encounter notes, upcoming appointments, etc.  Non-urgent messages can be sent to your provider as well.   To learn more about what you can do with MyChart, go to [email or web address].    Your next appointment:   6 month(s) AFTER YOU GET CT   The format for your next appointment:   In Person  Provider:   Skeet Latch, MD{      Disposition: FU with Arelia Volpe C. Oval Linsey, MD, [facility] in 6 months after repeat chest CT  I,Mykaella Javier,acting as a scribe for Skeet Latch, MD.,have documented all relevant documentation on the behalf of Skeet Latch, MD,as directed by  Skeet Latch, MD while in the presence of Skeet Latch, MD.  I, [place] Oval Linsey, MD have reviewed all documentation for this visit.  The documentation of the exam, diagnosis, procedures, and orders on 03/02/2021 are all accurate and complete.   Signed, Skeet Latch, MD  03/02/2021 5:43 PM    [place]

## 2021-03-02 NOTE — Patient Instructions (Signed)
Medication Instructions:  Your physician recommends that you continue on your current medications as directed. Please refer to the Current Medication list given to you today.   *If you need a refill on your cardiac medications before your next appointment, please call your pharmacy*   Lab Work: FASTING LP/CMET SOON  If you have labs (blood work) drawn today and your tests are completely normal, you will receive your results only by: MyChart Message (if you have MyChart) OR A paper copy in the mail If you have any lab test that is abnormal or we need to change your treatment, we will call you to review the results.   Testing/Procedures: CT IN ABOUT 6 MONTHS    Follow-Up: At [facility], you and your health needs are our priority.  As part of our continuing mission to provide you with exceptional heart care, we have created designated Provider Care Teams.  These Care Teams include your primary Cardiologist (physician) and Advanced Practice Providers (APPs -  Physician Assistants and Nurse Practitioners) who all work together to provide you with the care you need, when you need it.  We recommend signing up for the patient portal called "MyChart".  Sign up information is provided on this After Visit Summary.  MyChart is used to connect with patients for Virtual Visits (Telemedicine).  Patients are able to view lab/test results, encounter notes, upcoming appointments, etc.  Non-urgent messages can be sent to your provider as well.   To learn more about what you can do with MyChart, go to [email or web address].    Your next appointment:   6 month(s) AFTER YOU GET CT   The format for your next appointment:   In Person  Provider:   Chilton Si, MD{

## 2021-03-03 NOTE — Addendum Note (Signed)
Addended by: Regis Bill B on: 03/03/2021 10:22 AM   Modules accepted: Orders

## 2021-04-24 ENCOUNTER — Other Ambulatory Visit (HOSPITAL_BASED_OUTPATIENT_CLINIC_OR_DEPARTMENT_OTHER): Payer: Self-pay

## 2021-04-24 DIAGNOSIS — Z3041 Encounter for surveillance of contraceptive pills: Secondary | ICD-10-CM

## 2021-04-24 MED ORDER — NORETHIN ACE-ETH ESTRAD-FE 1-20 MG-MCG PO TABS: 1.0000 | ORAL_TABLET | Freq: Every day | ORAL | 2 refills | Status: DC

## 2021-07-21 ENCOUNTER — Other Ambulatory Visit (HOSPITAL_COMMUNITY)
Admission: RE | Admit: 2021-07-21 | Discharge: 2021-07-21 | Disposition: A | Discharge status: Home / Self Care | Payer: BC Managed Care – PPO | Source: Ambulatory Visit | Attending: Obstetrics & Gynecology | Admitting: Obstetrics & Gynecology

## 2021-07-21 ENCOUNTER — Encounter (HOSPITAL_BASED_OUTPATIENT_CLINIC_OR_DEPARTMENT_OTHER): Payer: Self-pay | Admitting: Obstetrics & Gynecology

## 2021-07-21 ENCOUNTER — Ambulatory Visit (INDEPENDENT_AMBULATORY_CARE_PROVIDER_SITE_OTHER): Payer: BC Managed Care – PPO | Admitting: Obstetrics & Gynecology

## 2021-07-21 VITALS — BP 134/96 | HR 109 | Ht <= 58 in | Wt 160.4 lb

## 2021-07-21 DIAGNOSIS — Z01419 Encounter for gynecological examination (general) (routine) without abnormal findings: Secondary | ICD-10-CM

## 2021-07-21 DIAGNOSIS — Z803 Family history of malignant neoplasm of breast: Secondary | ICD-10-CM | POA: Diagnosis not present

## 2021-07-21 DIAGNOSIS — Z124 Encounter for screening for malignant neoplasm of cervix: Secondary | ICD-10-CM | POA: Insufficient documentation

## 2021-07-21 DIAGNOSIS — Z800 Family history of malignant neoplasm of digestive organs: Secondary | ICD-10-CM

## 2021-07-21 DIAGNOSIS — M818 Other osteoporosis without current pathological fracture: Secondary | ICD-10-CM

## 2021-07-21 DIAGNOSIS — Z8619 Personal history of other infectious and parasitic diseases: Secondary | ICD-10-CM | POA: Insufficient documentation

## 2021-07-21 DIAGNOSIS — Q969 Turner's syndrome, unspecified: Secondary | ICD-10-CM

## 2021-07-21 DIAGNOSIS — L659 Nonscarring hair loss, unspecified: Secondary | ICD-10-CM

## 2021-07-21 DIAGNOSIS — Z3041 Encounter for surveillance of contraceptive pills: Secondary | ICD-10-CM

## 2021-07-21 MED ORDER — NORETHIN ACE-ETH ESTRAD-FE 1-20 MG-MCG PO TABS: 1.0000 | ORAL_TABLET | Freq: Every day | ORAL | 3 refills | Status: DC

## 2021-07-21 NOTE — Progress Notes (Signed)
46 y.o. G0P0000 [name] here for annual exam.  Former pt at [facility] care.  Last time I saw pt was 07/21/2018.  She is on OCPs.  Skips cycles on current medication from time to time.  Hasn't had a cycle in two months with current prescription.    Is followed by Dr. Duke Salvia for cardiac issues and risk of aortic aneurysm.  She has CTA scheduled 7/11.    Brother diagnosed before age 29 with colon cancer.  Pt has had colonoscopy in 2021.  Follow up 5 years recommended.  Had polyps.    Seeing Dr. Sharl Ma at [facility].  Had BMD 12/2020.  Having osteoporosis.    Sexually active: No.  The current method of family planning is OCP (estrogen/progesterone).    Smoker:  no  Health Maintenance: Pap:  07/11/2018 Negative History of abnormal Pap:  no MMG:  01/10/2021 Negative Colonoscopy:  07/29/2019 BMD:   01/18/2021 Screening Labs: lipid and cmp ordered by Dr. Duke Salvia.  Has not had done yet.     reports that she has never smoked. She has never used smokeless tobacco. She reports that she does not drink alcohol and does not use drugs.  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 90 tablet 3   lisinopril (PRINIVIL,ZESTRIL) 10 MG tablet  Take 1 tablet by mouth daily.  0   norethindrone-ethinyl estradiol-FE (AUROVELA FE 1/20) 1-20 MG-MCG tablet Take 1 tablet by mouth daily. 84 tablet 3   No current facility-administered medications for this visit.   Facility-Administered Medications Ordered in Other Visits  Medication Dose Route Frequency Provider Last Rate Last Admin   sodium chloride flush (NS) 0.9 % injection 10 mL  10 mL Intravenous PRN Revankar, Aundra Dubin, MD   10 mL at 09/20/20 1320    Family History  Problem Relation Age of Onset   Osteoporosis Mother    Breast cancer Mother 85       diagnosed 2019   Colon cancer Brother 27       unsure if had genetic testing   Breast cancer Maternal Grandmother 54   ROS: Genitourinary:negative  Exam:   BP (!) 134/96 (BP Location: Left Arm, Patient Position: Sitting, Cuff Size: Large)   Pulse (!) 109   Ht 4\' 9"  (1.448 m) Comment: reported  Wt 160 lb 6.4 oz (72.8 kg)   LMP 04/22/2021 (Approximate)   BMI 34.71 kg/m   Height: 4\' 9"  (144.8 cm) (reported)  General appearance: alert, cooperative and appears stated age Head: Normocephalic, without obvious abnormality, atraumatic Neck: no adenopathy, supple, symmetrical, trachea midline and thyroid normal to inspection and palpation Lungs: clear to auscultation bilaterally Breasts: normal appearance, no masses or tenderness Heart: regular rate and rhythm Abdomen: soft,  non-tender; bowel sounds normal; no masses,  no organomegaly Extremities: extremities normal, atraumatic, no cyanosis or edema Skin: Skin color, texture, turgor normal. No rashes or lesions Lymph nodes: Cervical, supraclavicular, and axillary nodes normal. No abnormal inguinal nodes palpated Neurologic: Grossly normal   Pelvic: External genitalia:  no lesions              Urethra:  normal appearing urethra with no masses, tenderness or lesions              Bartholins and Skenes: normal                 Vagina: normal appearing vagina with normal color and no  discharge, no lesions              Cervix: no lesions              Pap taken: Yes.   Bimanual Exam:  Uterus:  normal size, contour, position, consistency, mobility, non-tender              Adnexa: normal adnexa and no mass, fullness, tenderness               Rectovaginal: Confirms               Anus:  normal sphincter tone, no lesions  Chaperone, Ina Homes, CMA, was present for exam.  Assessment/Plan: 1. Well woman exam with routine gynecological exam - pap and HR HPV Obtained today - MMG up to date.  She and I have discussed MRI.  She declines for now. - colonoscopy up to date - pt aware I do not know when her last tdap was given.  Declines today.  2. Surveillance of contraceptive pill - norethindrone-ethinyl estradiol-FE (AUROVELA FE 1/20) 1-20 MG-MCG tablet; Take 1 tablet by mouth daily.  Dispense: 84 tablet; Refill: 3  3. Family history of breast cancer in mother - and in her maternal grandmother  86. Family history of colon cancer - diagnosed in brother  5. Cervical cancer screening - Cytology - PAP( [place])  6. Other osteoporosis without current pathological fracture - followed by Dr. Sharl Ma  7. Turner's syndrome, XO karyotype/no mosaicism  8. Alopecia

## 2021-07-26 LAB — CYTOLOGY - PAP
Adequacy: ABSENT
Comment: NEGATIVE
Diagnosis: NEGATIVE
High risk HPV: NEGATIVE

## 2021-08-01 ENCOUNTER — Ambulatory Visit (HOSPITAL_BASED_OUTPATIENT_CLINIC_OR_DEPARTMENT_OTHER)
Admission: RE | Admit: 2021-08-01 | Discharge: 2021-08-01 | Disposition: A | Discharge status: Home / Self Care | Payer: BC Managed Care – PPO | Source: Ambulatory Visit | Attending: Cardiovascular Disease | Admitting: Cardiovascular Disease

## 2021-08-01 ENCOUNTER — Encounter (HOSPITAL_BASED_OUTPATIENT_CLINIC_OR_DEPARTMENT_OTHER): Payer: Self-pay

## 2021-08-01 DIAGNOSIS — Q278 Other specified congenital malformations of peripheral vascular system: Secondary | ICD-10-CM | POA: Insufficient documentation

## 2021-08-01 DIAGNOSIS — I10 Essential (primary) hypertension: Secondary | ICD-10-CM | POA: Diagnosis present

## 2021-08-01 DIAGNOSIS — Q969 Turner's syndrome, unspecified: Secondary | ICD-10-CM | POA: Insufficient documentation

## 2021-08-01 MED ORDER — IOHEXOL 350 MG/ML SOLN
100.0000 mL | Freq: Once | INTRAVENOUS | Status: AC | PRN
  Administered 2021-08-01: 75 mL via INTRAVENOUS

## 2021-08-14 ENCOUNTER — Ambulatory Visit (HOSPITAL_BASED_OUTPATIENT_CLINIC_OR_DEPARTMENT_OTHER): Payer: BC Managed Care – PPO | Admitting: Obstetrics & Gynecology

## 2021-08-22 ENCOUNTER — Encounter (HOSPITAL_BASED_OUTPATIENT_CLINIC_OR_DEPARTMENT_OTHER): Payer: Self-pay

## 2021-08-28 ENCOUNTER — Telehealth: Payer: Self-pay | Admitting: Cardiovascular Disease

## 2021-08-28 DIAGNOSIS — Z5181 Encounter for therapeutic drug level monitoring: Secondary | ICD-10-CM

## 2021-08-28 DIAGNOSIS — E785 Hyperlipidemia, unspecified: Secondary | ICD-10-CM

## 2021-08-28 DIAGNOSIS — I10 Essential (primary) hypertension: Secondary | ICD-10-CM

## 2021-08-28 MED ORDER — ROSUVASTATIN CALCIUM 10 MG PO TABS: 10.0000 mg | ORAL_TABLET | Freq: Every day | ORAL | 3 refills | Status: DC

## 2021-08-28 NOTE — Telephone Encounter (Signed)
Pt returning a call from Regis Bill for test results

## 2021-08-28 NOTE — Telephone Encounter (Signed)
Pt. Returning call.

## 2021-08-28 NOTE — Telephone Encounter (Signed)
Dr Duke Salvia reviewed labs and would like patient to start Rosuvastatin 10 mg daily  LP/CMET in 2-3 months   Advised patient of lab results, mailed lab forms, and sent Rx to pharmacy

## 2021-09-20 ENCOUNTER — Ambulatory Visit (HOSPITAL_BASED_OUTPATIENT_CLINIC_OR_DEPARTMENT_OTHER): Payer: BC Managed Care – PPO | Admitting: Cardiovascular Disease

## 2021-09-20 ENCOUNTER — Encounter (HOSPITAL_BASED_OUTPATIENT_CLINIC_OR_DEPARTMENT_OTHER): Payer: Self-pay | Admitting: Cardiovascular Disease

## 2021-09-20 VITALS — BP 106/76 | HR 93 | Ht <= 58 in | Wt 159.3 lb

## 2021-09-20 DIAGNOSIS — E6609 Other obesity due to excess calories: Secondary | ICD-10-CM

## 2021-09-20 DIAGNOSIS — Z6833 Body mass index (BMI) 33.0-33.9, adult: Secondary | ICD-10-CM

## 2021-09-20 DIAGNOSIS — I728 Aneurysm of other specified arteries: Secondary | ICD-10-CM

## 2021-09-20 DIAGNOSIS — Z5181 Encounter for therapeutic drug level monitoring: Secondary | ICD-10-CM

## 2021-09-20 DIAGNOSIS — I10 Essential (primary) hypertension: Secondary | ICD-10-CM | POA: Diagnosis not present

## 2021-09-20 DIAGNOSIS — E785 Hyperlipidemia, unspecified: Secondary | ICD-10-CM | POA: Diagnosis not present

## 2021-09-20 DIAGNOSIS — I2510 Atherosclerotic heart disease of native coronary artery without angina pectoris: Secondary | ICD-10-CM

## 2021-09-20 DIAGNOSIS — Q969 Turner's syndrome, unspecified: Secondary | ICD-10-CM

## 2021-09-20 NOTE — Progress Notes (Signed)
Cardiology Office Note   Date:  09/20/2021   ID:  Mercedes Barry, DOB 07/30/1975, MRN [id number]  Visit Type: Office  PCP:  Dois Davenport, MD  Cardiologist:  None  Electrophysiologist:  None   Evaluation Performed:  Follow-Up Visit  History of Present Illness:    Mercedes Barry is a 46 y.o. female with a hx of hypertension, Turner's syndrome, and hypothyroidism here today for follow-up. She was noted to have a murmur and was referred for an echo which revealed LVEF 60-65% and mild LVH. Her diastolic function was normal. She had a non-contrast chest CT which revealed some coronary calcification. She was referred for a nuclear stress test 08/2020 which was low-risk. She had an echo with bubble that revealed a persistent L SVC.   Her fiancee passed away due to a congenital heart defect which caused her stress and she was told she had a heart murmur. Coronary CTA 12/2020 showed moderate coronary atherosclerosis. She did not have an aortic aneurysm. However, it did show L subclavian artery dilation to 1.7 cm and significant atherosclerosis in the LAD artery. She had lipids drawn 07/2021 and her LDL was 94, so she was started on rosuvastatin. Chest CTA 07/2021 showed that her left subclavian ectasia was stable. It showed coronary calcification most prominent in LAD. They recommended 1 year follow up.   Today, she has been doing well. She expresses her frustration by her elevated cholesterol. She does not perform much formal exercise. 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 In regards to her diet, she says she has been cooking at home more. She has been using [place], a programmable cooking appliance, which has been very convenient for her lifestyle and health. She denies any palpitations, chest pain, shortness of breath, or peripheral edema. No lightheadedness, headaches, syncope, orthopnea, or PND.   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   lisinopril (PRINIVIL,ZESTRIL) 10 MG tablet Take 1 tablet by mouth daily.   metFORMIN (GLUCOPHAGE-XR) 500 MG 24 hr tablet Take 500 mg by mouth daily.   norethindrone-ethinyl estradiol-FE (AUROVELA FE 1/20) 1-20 MG-MCG tablet Take 1 tablet by mouth daily.   rosuvastatin (CRESTOR) 10 MG tablet Take 1 tablet (10 mg total) by mouth daily.     Allergies:   Amoxicillin-pot clavulanate, Cefprozil, Entex, Penicillins, and Cephalosporins   Social History   Socioeconomic History   Marital status: Single    Spouse name: engaged-Robby   Number of children: 0   Years of education: Not on file   Highest education level: Not on file  Occupational History   Occupation: Runner, broadcasting/film/video    Employer: [employer]  Tobacco Use   Smoking status: Never  Smokeless tobacco: Never  Vaping Use   Vaping Use: Never used  Substance and Sexual Activity   Alcohol use: No   Drug use: No   Sexual activity: Not Currently    Partners: Male    Birth control/protection: Pill  Other Topics Concern   Not on file  Social History Narrative   Lives with her fiance, Robby. They plan to marry in 10/2015. Her parents live in [place], [place].   Social Determinants of Health   Financial Resource  Strain: Not on file  Food Insecurity: Not on file  Transportation Needs: Not on file  Physical Activity: Not on file  Stress: Not on file  Social Connections: Not on file     Family History: The patient's family history includes Breast cancer (age of onset: 43) in her maternal grandmother; Breast cancer (age of onset: 42) in her mother; Colon cancer (age of onset: 87) in her brother; Osteoporosis in her mother.  ROS:   Please see the history of present illness.    All other systems reviewed and are negative.  EKGs/Labs/Other Studies Reviewed:    The following studies were reviewed today:  CTA Chest 08/01/2021: IMPRESSION: 1. No acute intrathoracic or vascular abnormality. 2. Stable size and appearance of ectatic origin of the LEFT subclavian artery, measuring 1.7 cm. Consider follow-up with CTA chest in 12 months for routine surveillance. 3. Coronary artery atherosclerosis, prominently within the LAD and greater than expected in a patient of this age.   CTA Chest 01/19/2021 1. No CTA evidence of thoracic aortic aneurysm or dissection. 2. Moderate burden of coronary atherosclerosis, greatest burden within the LAD. Findings are greater than expected in a patient of this age. 3. Ectatic origin of the LEFT subclavian artery, measuring 1.7 cm. Attention on follow-up.  Echo 09/20/2020  1. The coronary sinus does appear prominant. With the bubble study, the  bubbles did appear in the coronary sinus before the RV which does support the diagnosis of persistent left SVC . The images are somewhat difficult  because of multiple bubble  injections and suboptimal image quality.  Lexiscan 09/02/2020 The left ventricular ejection fraction is normal (55-65%). Nuclear stress EF: 64%. Downsloping ST segment depression ST segment depression was noted during stress in the II, III, aVF, V3, V4 and V5 leads, and returning to baseline after less than 1 minute of recovery. The study is  normal. This is a low risk study.   Overall low counts intracardiac with significant extracardiac activity, may limit evaluation. No apparent reversible ischemia. ST changes during infusion of lexiscan.  Echo 08/19/2020 1. ? dilated coronary sinuys consider f/u contrast study with iv in left  arm.   2. Left ventricular ejection fraction, by estimation, is 60 to 65%. The  left ventricle has normal function. The left ventricle has no regional  wall motion abnormalities. There is mild left ventricular hypertrophy.  Left ventricular diastolic parameters  were normal.   3. Right ventricular systolic function is normal. The right ventricular  size is normal.   4. Left atrial size was moderately dilated.   5. Right atrial size was moderately dilated.   6. The mitral valve is normal in structure. No evidence of mitral valve  regurgitation. No evidence of mitral stenosis.   7. The aortic valve is tricuspid. Aortic valve regurgitation is not  visualized. No aortic stenosis is present.   8. The inferior vena cava is normal in size with greater than 50%  respiratory variability, suggesting right atrial pressure of  3 mmHg.   CT Chest 08/19/2020 IMPRESSION: 1. No acute findings. 2. Partially imaged vague low-attenuation lesion in the left hepatic lobe, slightly larger than on 08/16/2015 but characterized as a hemangioma on that study. 3. Coronary artery calcification.  EKG: EKG is personally reviewed.  09/20/21: Sinus rhythm. Rate 93 bpm. Low voltage. Nonspecific ST changes.  Recent Labs: 01/30/2021: ALT 15; BUN 13; Creatinine, Ser 0.60; Hemoglobin 13.3; Platelets 315; Potassium 4.2; Sodium 137   Recent Lipid Panel    Component Value Date/Time   CHOL 165 07/04/2014 0907   TRIG 84 07/04/2014 0907   HDL 79 07/04/2014 0907   CHOLHDL 2.1 07/04/2014 0907   VLDL 17 07/04/2014 0907   LDLCALC 69 07/04/2014 0907    Physical Exam:    VS:  BP 106/76 (BP Location: Left Arm, Patient Position:  Sitting, Cuff Size: Normal)   Pulse 93   Ht 4\' 9"  (1.448 m)   Wt 159 lb 4.8 oz (72.3 kg)   BMI 34.47 kg/m  , BMI Body mass index is 34.47 kg/m. GENERAL:  Well appearing HEENT: Pupils equal round and reactive, fundi not visualized, oral mucosa unremarkable NECK:  No jugular venous distention, waveform within normal limits, carotid upstroke brisk and symmetric, no bruits, no thyromegaly LUNGS:  Clear to auscultation bilaterally HEART:  RRR.  PMI not displaced or sustained,S1 and S2 within normal limits, no S3, no S4, no clicks, no rubs, no murmurs ABD:  Flat, positive bowel sounds normal in frequency in pitch, no bruits, no rebound, no guarding, no midline pulsatile mass, no hepatomegaly, no splenomegaly EXT:  2 plus pulses throughout, no edema, no cyanosis no clubbing SKIN:  No rashes no nodules NEURO:  Cranial nerves II through XII grossly intact, motor grossly intact throughout PSYCH:  Cognitively intact, oriented to person place and time    ASSESSMENT:    1. Essential hypertension   2. Hyperlipidemia, unspecified hyperlipidemia type   3. Therapeutic drug monitoring   4. Aneurysm of left subclavian artery (HCC)   5. CAD in native artery   6. Subclavian aneurysm (HCC)   7. Turner's syndrome   8. Class 1 obesity due to excess calories with serious comorbidity and body mass index (BMI) of 33.0 to 33.9 in adult      PLAN:    In order of problems listed above:  No problem-specific Assessment & Plan notes found for this encounter. # Dyslipidemia - Continue monitoring cholesterol levels - Reassess lipid profile with fasting lipids/CMP in early December.  Continue rosuvastatin.  # Hypertension - Continue Lisinopril for blood pressure control - Encourage patient to maintain a low-sodium diet  # Subclavian artery dilation (1.7 cm) - Repeat CT-A in one year to monitor for any changes - Continue blood pressure control to prevent further dilation  # CAD: - Encourage patient to  maintain a healthy diet and exercise regimen - Monitor cholesterol levels and reassess in 3-4 months.  LDL goal <70.   - Continue rosuvastatin  Medication Adjustments/Labs and Tests Ordered: Current medicines are reviewed at length with the patient today.  Concerns regarding medicines are outlined above.  Orders Placed This Encounter  Procedures   CT ANGIO CHEST AORTA W/CM & OR WO/CM   Lipid panel   Comprehensive metabolic panel   EKG 12-Lead   Patient Instructions  Medication Instructions:  Your physician recommends that you continue on your current medications as directed. Please refer to the Current Medication list given to you today.   *If you need a  refill on your cardiac medications before your next appointment, please call your pharmacy*  Lab Work: FASTING LP/CMET SOON   If you have labs (blood work) drawn today and your tests are completely normal, you will receive your results only by: MyChart Message (if you have MyChart) OR A paper copy in the mail If you have any lab test that is abnormal or we need to change your treatment, we will call you to review the results.  Testing/Procedures: CTA CHEST IN JULY   Follow-Up: At [facility], you and your health needs are our priority.  As part of our continuing mission to provide you with exceptional heart care, we have created designated Provider Care Teams.  These Care Teams include your primary Cardiologist (physician) and Advanced Practice Providers (APPs -  Physician Assistants and Nurse Practitioners) who all work together to provide you with the care you need, when you need it.  We recommend signing up for the patient portal called "MyChart".  Sign up information is provided on this After Visit Summary.  MyChart is used to connect with patients for Virtual Visits (Telemedicine).  Patients are able to view lab/test results, encounter notes, upcoming appointments, etc.  Non-urgent messages can be sent to your provider  as well.   To learn more about what you can do with MyChart, go to [email or web address].    Your next appointment:   11 month(s)  The format for your next appointment:   In Person  Provider:   Chilton Si, MD              Disposition:  FU with lab in 3-4 months. FU with Daianna Vasques C. Duke Salvia, MD, [facility] after 1 year CT scan.    I,Breanna Adamick,acting as a scribe for Chilton Si, MD.,have documented all relevant documentation on the behalf of Chilton Si, MD,as directed by  Chilton Si, MD while in the presence of Chilton Si, MD.    I, Derion Kreiter C. Duke Salvia, MD have reviewed all documentation for this visit.  The documentation of the exam, diagnosis, procedures, and orders on 09/20/2021 are all accurate and complete.   Signed, Chilton Si, MD  09/20/2021 5:32 PM    [facility]

## 2021-09-20 NOTE — Patient Instructions (Signed)
Medication Instructions:  Your physician recommends that you continue on your current medications as directed. Please refer to the Current Medication list given to you today.   *If you need a refill on your cardiac medications before your next appointment, please call your pharmacy*  Lab Work: FASTING LP/CMET SOON   If you have labs (blood work) drawn today and your tests are completely normal, you will receive your results only by: MyChart Message (if you have MyChart) OR A paper copy in the mail If you have any lab test that is abnormal or we need to change your treatment, we will call you to review the results.  Testing/Procedures: CTA CHEST IN JULY   Follow-Up: At [facility], you and your health needs are our priority.  As part of our continuing mission to provide you with exceptional heart care, we have created designated Provider Care Teams.  These Care Teams include your primary Cardiologist (physician) and Advanced Practice Providers (APPs -  Physician Assistants and Nurse Practitioners) who all work together to provide you with the care you need, when you need it.  We recommend signing up for the patient portal called "MyChart".  Sign up information is provided on this After Visit Summary.  MyChart is used to connect with patients for Virtual Visits (Telemedicine).  Patients are able to view lab/test results, encounter notes, upcoming appointments, etc.  Non-urgent messages can be sent to your provider as well.   To learn more about what you can do with MyChart, go to [email or web address].    Your next appointment:   11 month(s)  The format for your next appointment:   In Person  Provider:   Chilton Si, MD

## 2022-01-23 ENCOUNTER — Telehealth (HOSPITAL_BASED_OUTPATIENT_CLINIC_OR_DEPARTMENT_OTHER): Payer: Self-pay | Admitting: Obstetrics & Gynecology

## 2022-01-23 NOTE — Telephone Encounter (Signed)
Pt called with complaints of irregular bleeding. She had a cycle on 01/06/22 and it has not stopped. Pt is on OCPs for bleeding but had a mix-up with refilling her prescription. When she went to obtain a refill, the pharmacy refilled her norethindrone and not her Blisovi. Pt also states that she informed her PCP about the abnormal bleeding and her PCP prescribed medroxyprogesterone but it has not stopped the bleeding. Pt provided with appt for evaluation with provider.

## 2022-01-23 NOTE — Telephone Encounter (Signed)
Patient called and stated she returning the nurse call.

## 2022-01-25 ENCOUNTER — Encounter (HOSPITAL_BASED_OUTPATIENT_CLINIC_OR_DEPARTMENT_OTHER): Payer: Self-pay | Admitting: Obstetrics & Gynecology

## 2022-01-25 ENCOUNTER — Ambulatory Visit (HOSPITAL_BASED_OUTPATIENT_CLINIC_OR_DEPARTMENT_OTHER): Payer: BC Managed Care – PPO | Admitting: Obstetrics & Gynecology

## 2022-01-25 VITALS — BP 132/78 | HR 86 | Ht <= 58 in | Wt 139.6 lb

## 2022-01-25 DIAGNOSIS — N926 Irregular menstruation, unspecified: Secondary | ICD-10-CM

## 2022-01-25 NOTE — Progress Notes (Signed)
GYNECOLOGY  VISIT  CC:   irregular bleeding  HPI: 47 y.o. G0P0000 [name] here for complaint of irregular bleeding.  She reports she got a different generic OCP in September.  She realized she had gotten this from her PCP and was not the blisovi that she'd taken in the past.  So, in early December, she switched back to the blisovi and she started having some irregular bleeding.  Today is the first day without bleeding since Dec 16th.  Also, Dr. Darron Doom gave her provera 5mg .  She took this for 10 days and it didn't stop the bleeding.  In her current pack of pills she is on her placebo pills.  Will start next pack on Sunday.   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 90 tablet 3   lisinopril (PRINIVIL,ZESTRIL) 10 MG tablet Take 1 tablet by mouth daily.  0   metFORMIN (GLUCOPHAGE-XR) 500 MG 24 hr tablet Take 500 mg by mouth daily.     norethindrone-ethinyl estradiol-FE (AUROVELA FE 1/20) 1-20 MG-MCG tablet Take 1 tablet by mouth daily. 84 tablet 3   rosuvastatin (CRESTOR) 10 MG tablet Take 1 tablet (10 mg total) by mouth daily. 90 tablet 3   Current Facility-Administered Medications on File Prior to Visit  Medication Dose Route Frequency Provider Last Rate Last Admin   sodium chloride flush (NS) 0.9 % injection 10 mL  10 mL Intravenous PRN Revankar,  Reita Cliche, MD   10 mL at 09/20/20 1320    ALLERGIES: Amoxicillin-pot clavulanate, Cefprozil, Entex, Penicillins, and Cephalosporins  SH:  single, non smoker  Review of Systems  Constitutional: Negative.   Genitourinary:        Irregular bleeding    PHYSICAL EXAMINATION:    BP (!) 140/87 (BP Location: Right Arm, Patient Position: Sitting, Cuff Size: Normal)   Pulse 86   Ht 4\' 9"  (1.448 m) Comment: Reported  Wt 139 lb 9.6 oz (63.3 kg)   BMI 30.21 kg/m     General appearance: alert, cooperative and appears stated age No exam done today  Assessment/Plan: 1. Irregular bleeding - pt will start next pack of pills on Sunday.  Feel should consider switching to progesterone only pills (given atherosclerosis on CT scan done earlier this year) but would like to see if irregular bleeding resolved with next cycle.  She does have osteopenia/porosis and given age would benefit from estradiol as well.  May need to reach out to cardiology prior to switching.  Will see how bleeding is this next month and she is going to give update after next cycle.

## 2022-03-14 ENCOUNTER — Encounter (HOSPITAL_BASED_OUTPATIENT_CLINIC_OR_DEPARTMENT_OTHER): Payer: Self-pay | Admitting: Obstetrics & Gynecology

## 2022-04-23 ENCOUNTER — Other Ambulatory Visit: Payer: Self-pay | Admitting: Physical Medicine and Rehabilitation

## 2022-04-23 DIAGNOSIS — Z1231 Encounter for screening mammogram for malignant neoplasm of breast: Secondary | ICD-10-CM

## 2022-04-24 ENCOUNTER — Ambulatory Visit: Payer: BC Managed Care – PPO

## 2022-04-25 ENCOUNTER — Ambulatory Visit
Admission: RE | Admit: 2022-04-25 | Discharge: 2022-04-25 | Disposition: A | Discharge status: Home / Self Care | Payer: BC Managed Care – PPO | Source: Ambulatory Visit | Attending: Physical Medicine and Rehabilitation | Admitting: Physical Medicine and Rehabilitation

## 2022-04-25 DIAGNOSIS — Z1231 Encounter for screening mammogram for malignant neoplasm of breast: Secondary | ICD-10-CM

## 2022-07-24 ENCOUNTER — Telehealth (HOSPITAL_BASED_OUTPATIENT_CLINIC_OR_DEPARTMENT_OTHER): Payer: Self-pay | Admitting: Cardiovascular Disease

## 2022-07-24 NOTE — Telephone Encounter (Signed)
Called patient to discuss scheduling the  follow up CTA chest/aorta ordered by Dr. Duke Salvia.  Patient is asking if it is really necessary to have this done this year--she is still paying on the one she had last year and wants to know if we can wait until 2025 for a repeat

## 2022-07-24 NOTE — Telephone Encounter (Signed)
Please advise 

## 2022-07-25 NOTE — Telephone Encounter (Signed)
Returned call to patient, no answer, left detailed message with the following recommendations ( ok per DPR)     "Based on Radiology's recommendations, it should be this year.  However, I understand.  It was stable from prior imaging.  If she wants to wait another 6 months that is OK but I think a year is too far.  TCR"

## 2022-08-03 ENCOUNTER — Ambulatory Visit (HOSPITAL_BASED_OUTPATIENT_CLINIC_OR_DEPARTMENT_OTHER): Payer: BC Managed Care – PPO | Admitting: Obstetrics & Gynecology

## 2022-08-03 ENCOUNTER — Telehealth (HOSPITAL_BASED_OUTPATIENT_CLINIC_OR_DEPARTMENT_OTHER): Payer: Self-pay | Admitting: Cardiovascular Disease

## 2022-08-03 NOTE — Telephone Encounter (Signed)
Spoke with patient regarding the upcoming CTA chest/aorta appointment on Saturday 08/11/22 at 10:00 am here at [facility]----patient to arrive at 9:45 am in the ED for check in .  Patient to have lab work completed the week of 08/05/22 (early in the week).  She voiced her understanding

## 2022-08-07 ENCOUNTER — Telehealth: Payer: Self-pay | Admitting: Cardiovascular Disease

## 2022-08-07 NOTE — Telephone Encounter (Signed)
Pt called in stating she went to go get labs done prior to CT however there were no orders. She ask that someone call her when labs have been put in or faxed to number below.    Fax number: [phone]

## 2022-08-07 NOTE — Telephone Encounter (Signed)
Returned call to patient and verified that there are active lab orders in her chart. Advised her that she is getting the CMP/Lipid ordered 8/23. Labs printed and faxed to fax number given.

## 2022-08-08 ENCOUNTER — Encounter (HOSPITAL_BASED_OUTPATIENT_CLINIC_OR_DEPARTMENT_OTHER): Payer: Self-pay

## 2022-08-08 ENCOUNTER — Ambulatory Visit (HOSPITAL_BASED_OUTPATIENT_CLINIC_OR_DEPARTMENT_OTHER): Payer: BC Managed Care – PPO | Admitting: Obstetrics & Gynecology

## 2022-08-08 LAB — COMPREHENSIVE METABOLIC PANEL
ALT: 16 IU/L (ref 0–32)
AST: 21 IU/L (ref 0–40)
Albumin: 4.1 g/dL (ref 3.9–4.9)
Alkaline Phosphatase: 65 IU/L (ref 44–121)
BUN/Creatinine Ratio: 17 (ref 9–23)
BUN: 12 mg/dL (ref 6–24)
Bilirubin Total: <0.2 mg/dL (ref 0.0–1.2)
CO2: 23 mmol/L (ref 20–29)
Calcium: 9.3 mg/dL (ref 8.7–10.2)
Chloride: 100 mmol/L (ref 96–106)
Creatinine, Ser: 0.7 mg/dL (ref 0.57–1.00)
Globulin, Total: 2.2 g/dL (ref 1.5–4.5)
Glucose: 174 mg/dL — ABNORMAL HIGH (ref 70–99)
Potassium: 4.5 mmol/L (ref 3.5–5.2)
Sodium: 141 mmol/L (ref 134–144)
Total Protein: 6.3 g/dL (ref 6.0–8.5)
eGFR: 108 mL/min/{1.73_m2} (ref >59)

## 2022-08-08 LAB — LIPID PANEL
Chol/HDL Ratio: 2.2 ratio (ref 0.0–4.4)
Cholesterol, Total: 146 mg/dL (ref 100–199)
HDL: 65 mg/dL (ref >39)
LDL Chol Calc (NIH): 57 mg/dL (ref 0–99)
Triglycerides: 143 mg/dL (ref 0–149)
VLDL Cholesterol Cal: 24 mg/dL (ref 5–40)

## 2022-08-11 ENCOUNTER — Ambulatory Visit (HOSPITAL_BASED_OUTPATIENT_CLINIC_OR_DEPARTMENT_OTHER)
Admission: RE | Admit: 2022-08-11 | Discharge: 2022-08-11 | Disposition: A | Discharge status: Home / Self Care | Payer: BC Managed Care – PPO | Source: Ambulatory Visit | Attending: Cardiovascular Disease | Admitting: Cardiovascular Disease

## 2022-08-11 DIAGNOSIS — I728 Aneurysm of other specified arteries: Secondary | ICD-10-CM | POA: Insufficient documentation

## 2022-08-11 MED ORDER — IOHEXOL 350 MG/ML SOLN
100.0000 mL | Freq: Once | INTRAVENOUS | Status: AC | PRN
  Administered 2022-08-11: 80 mL via INTRAVENOUS

## 2022-08-15 ENCOUNTER — Ambulatory Visit (INDEPENDENT_AMBULATORY_CARE_PROVIDER_SITE_OTHER): Payer: BC Managed Care – PPO | Admitting: Obstetrics & Gynecology

## 2022-08-15 ENCOUNTER — Encounter (HOSPITAL_BASED_OUTPATIENT_CLINIC_OR_DEPARTMENT_OTHER): Payer: Self-pay | Admitting: Obstetrics & Gynecology

## 2022-08-15 VITALS — BP 127/87 | HR 99 | Ht <= 58 in | Wt 137.4 lb

## 2022-08-15 DIAGNOSIS — Z1331 Encounter for screening for depression: Secondary | ICD-10-CM

## 2022-08-15 DIAGNOSIS — Z01419 Encounter for gynecological examination (general) (routine) without abnormal findings: Secondary | ICD-10-CM

## 2022-08-15 DIAGNOSIS — Q969 Turner's syndrome, unspecified: Secondary | ICD-10-CM

## 2022-08-15 DIAGNOSIS — Z79890 Hormone replacement therapy (postmenopausal): Secondary | ICD-10-CM

## 2022-08-15 DIAGNOSIS — I10 Essential (primary) hypertension: Secondary | ICD-10-CM | POA: Diagnosis not present

## 2022-08-15 DIAGNOSIS — C4491 Basal cell carcinoma of skin, unspecified: Secondary | ICD-10-CM

## 2022-08-15 DIAGNOSIS — N915 Oligomenorrhea, unspecified: Secondary | ICD-10-CM | POA: Diagnosis not present

## 2022-08-15 DIAGNOSIS — E119 Type 2 diabetes mellitus without complications: Secondary | ICD-10-CM

## 2022-08-15 DIAGNOSIS — L658 Other specified nonscarring hair loss: Secondary | ICD-10-CM

## 2022-08-15 DIAGNOSIS — E039 Hypothyroidism, unspecified: Secondary | ICD-10-CM

## 2022-08-15 DIAGNOSIS — M816 Localized osteoporosis [Lequesne]: Secondary | ICD-10-CM

## 2022-08-15 HISTORY — DX: Basal cell carcinoma of skin, unspecified: C44.91

## 2022-08-15 NOTE — Progress Notes (Signed)
47 y.o. G0P0000 [name] here for annual exam. On OCPs.  Does have menstrual cycles but they are very light.  Has skipped some cycles.    Followed by Dr. Duke Salvia for cardiac issues and risk for aortic aneurysm. Last CT was 7/20 and findings were stable.        Sexually active: No.  The current method of family planning is abstinence.      Smoker:  no  Health Maintenance: Pap:  07/21/2021 Negative History of abnormal Pap:  no MMG:  04/25/2022 Negative Colonoscopy:  07/29/2019, follow up 5 years BMD:   12/2020 Screening Labs: Just done.     reports that she has never smoked. She has never used smokeless tobacco. She reports that she does not drink alcohol and does not use drugs.  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 90 tablet 3   lisinopril (PRINIVIL,ZESTRIL) 10 MG tablet Take 1 tablet by mouth daily.  0   metFORMIN (GLUCOPHAGE-XR) 500 MG 24 hr tablet Take 500 mg by mouth daily.     norethindrone-ethinyl estradiol-FE (AUROVELA FE 1/20) 1-20 MG-MCG tablet Take 1 tablet by mouth daily. 84 tablet 3   rosuvastatin (CRESTOR) 10 MG tablet Take 1 tablet (10 mg total) by mouth daily. 90 tablet 3   No current facility-administered medications for this visit.    Facility-Administered Medications Ordered in Other Visits  Medication Dose Route Frequency Provider Last Rate Last Admin   sodium chloride flush (NS) 0.9 % injection 10 mL  10 mL Intravenous PRN Revankar, Aundra Dubin, MD   10 mL at 09/20/20 1320    Family History  Problem Relation Age of Onset   Osteoporosis Mother    Breast cancer Mother 72       diagnosed 2019   Colon cancer Brother 71       unsure if had genetic testing   Breast cancer Maternal Grandmother 54    ROS: Constitutional: negative Genitourinary:negative  Exam:   BP 127/87 (BP Location: Left Arm, Patient Position: Sitting, Cuff Size: Normal)   Pulse 99   Ht 4\' 9"  (1.448 m) Comment: Reported  Wt 137 lb 6.4 oz (62.3 kg)   BMI 29.73 kg/m   Height: 4\' 9"  (144.8 cm) (Reported)  General appearance: alert, cooperative and appears stated age Head: Normocephalic, without obvious abnormality, atraumatic Neck: no adenopathy, supple, symmetrical, trachea midline and thyroid normal to inspection and palpation Lungs: clear to auscultation bilaterally Breasts: normal appearance, no masses or tenderness Heart: regular rate and rhythm Abdomen: soft, non-tender; bowel sounds normal; no masses,  no organomegaly Extremities: extremities normal, atraumatic, no cyanosis or edema Skin: Skin color, texture, turgor normal. No rashes or lesions Lymph nodes: Cervical, supraclavicular, and axillary nodes normal. No abnormal inguinal nodes palpated Neurologic:  Grossly normal   Pelvic: External genitalia:  no lesions              Urethra:  normal appearing urethra with no masses, tenderness or lesions              Bartholins and Skenes: normal                 Vagina: normal appearing vagina with normal color and no discharge, no lesions              Cervix: no lesions              Pap taken: No. Bimanual Exam:  Uterus:  normal size, contour, position, consistency, mobility, non-tender              Adnexa: normal adnexa and no mass,  fullness, tenderness               Rectovaginal: Confirms               Anus:  normal sphincter tone, no lesions  Chaperone, Ina Homes, CMA, was present for exam.  Assessment/Plan: 1. Well woman exam with routine gynecological exam - Pap smear 2023 - Mammogram 04/2022 - Colonoscopy 2021, follow up 5 years - Bone mineral density 12/2020 - lab work done and reviewed in Epic - vaccines reviewed/updated  2. Type 2 diabetes mellitus without complication, without long-term current use of insulin (HCC) - Ambulatory referral to Endocrinology  3. Oligomenorrhea, unspecified type - Follicle stimulating hormone  4. Essential hypertension - on lisinopril  5. 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 Acquired hypothyroidism - on levothyroxine  7. Localized osteoporosis without current pathological fracture  8. Turner's syndrome

## 2022-08-15 NOTE — Addendum Note (Signed)
Addended by: Jerene Bears on: 08/15/2022 02:44 PM   Modules accepted: Orders

## 2022-08-16 LAB — FOLLICLE STIMULATING HORMONE: FSH: 44.2 m[IU]/mL

## 2022-08-16 MED ORDER — ESTRADIOL-NORETHINDRONE ACET 1-0.5 MG PO TABS
1.0000 | ORAL_TABLET | Freq: Every day | ORAL | 12 refills | Status: DC
Start: 2022-08-16 — End: 2023-02-19

## 2022-08-16 NOTE — Addendum Note (Signed)
Addended by: Jerene Bears on: 08/16/2022 05:01 AM   Modules accepted: Orders

## 2022-08-20 ENCOUNTER — Encounter (HOSPITAL_BASED_OUTPATIENT_CLINIC_OR_DEPARTMENT_OTHER): Payer: Self-pay | Admitting: Family

## 2022-08-20 ENCOUNTER — Ambulatory Visit (HOSPITAL_BASED_OUTPATIENT_CLINIC_OR_DEPARTMENT_OTHER): Payer: BC Managed Care – PPO | Admitting: Family

## 2022-08-20 VITALS — BP 122/74 | HR 86 | Ht <= 58 in | Wt 140.0 lb

## 2022-08-20 DIAGNOSIS — I728 Aneurysm of other specified arteries: Secondary | ICD-10-CM | POA: Diagnosis not present

## 2022-08-20 DIAGNOSIS — I2510 Atherosclerotic heart disease of native coronary artery without angina pectoris: Secondary | ICD-10-CM

## 2022-08-20 DIAGNOSIS — I10 Essential (primary) hypertension: Secondary | ICD-10-CM | POA: Diagnosis not present

## 2022-08-20 DIAGNOSIS — E785 Hyperlipidemia, unspecified: Secondary | ICD-10-CM

## 2022-08-20 MED ORDER — ROSUVASTATIN CALCIUM 10 MG PO TABS
10.0000 mg | ORAL_TABLET | Freq: Every day | ORAL | 3 refills | Status: DC
Start: 2022-08-20 — End: 2023-09-17

## 2022-08-20 NOTE — Patient Instructions (Addendum)
Medication Instructions:  Continue your current medications.  *If you need a refill on your cardiac medications before your next appointment, please call your pharmacy*   Lab Work: Your recent lab work showed your cholesterol, kidneys, and liver looked great!  Testing/Procedures: Your CT showed your subclavian artery was stable. We will plan to repeat CT ANGIO CHEST AORTA in 2 years for monitoring.   Follow-Up: At [facility], you and your health needs are our priority.  As part of our continuing mission to provide you with exceptional heart care, we have created designated Provider Care Teams.  These Care Teams include your primary Cardiologist (physician) and Advanced Practice Providers (APPs -  Physician Assistants and Nurse Practitioners) who all work together to provide you with the care you need, when you need it.  We recommend signing up for the patient portal called "MyChart".  Sign up information is provided on this After Visit Summary.  MyChart is used to connect with patients for Virtual Visits (Telemedicine).  Patients are able to view lab/test results, encounter notes, upcoming appointments, etc.  Non-urgent messages can be sent to your provider as well.   To learn more about what you can do with MyChart, go to [email or web address].    Your next appointment:   1 year(s)  Provider:   Chilton Si, MD or Gillian Shields, NP    Other Instructions  [email or web address]  Fab[employer]ies on Youtube for quick, easy workout

## 2022-08-20 NOTE — Progress Notes (Signed)
Cardiology Office Note:  .   Date:  08/20/2022  ID:  Mercedes Barry, DOB [birth year], MRN [id number] PCP: Dois Davenport, MD  [facility] Providers Cardiologist:  Chilton Si, MD    History of Present Illness: .   Mercedes Barry is a 47 y.o. female with hx of Turner's syndrome, HTN, hypothyroidism, coronary artery disease, DM2, hypothyroidism.   Prior echocardiogram 08/11/2020 LVEF 60 to 65%, no RWMA, mild LVH, RV normal, bilateral atria moderately dilated, tricuspid AV with no significant valvular abnormalities. Myoview 08/2020 low risk.   Coronary CTA 12/2020 moderate coronary atherosclerosis (most significant in LAD), no aortic aneurysm, left subclavian artery dilation to 1.7 cm.  She was started on rosuvastatin.  Chest CTA 07/2021 showed left subclavian ectasia was stable.  Last seen 09/20/21 doing well from a cardiac perspective.  Updated CTA 08/11/2022 was stable with no change in ectatic left subclavian artery recommended to consider repeat imaging in 1-2 years to confirm continued stability.   Presents in follow-up with family.  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  We reviewed her CTs and recent lipids panel.  Note she lost 20 pounds and her A1c went to 5.9 then regained 10 pounds and A1c up to 6.8.  Interested in resources for following a lower carbohydrate diet.  She has no formal exercise routine but plans to add small amounts of exercise throughout her day.  ROS: Please see the history of present illness.    All other systems reviewed and are negative.   Studies Reviewed: Marland Kitchen   EKG Interpretation Date/Time:  Monday August 20 2022 11:58:52 EDT Ventricular Rate:  89 PR Interval:  120 QRS Duration:  84 QT Interval:  364 QTC Calculation: 442 R Axis:   42  Text Interpretation: Normal sinus rhythm Normal ECG Confirmed by Gillian Shields ([id number]) on 08/20/2022 11:59:29 AM    Cardiac Studies & Procedures     STRESS TESTS  MYOCARDIAL  PERFUSION IMAGING 09/02/2020  Narrative  The left ventricular ejection fraction is normal (55-65%).  Nuclear stress EF: 64%.  Downsloping ST segment depression ST segment depression was noted during stress in the II, III, aVF, V3, V4 and V5 leads, and returning to baseline after less than 1 minute of recovery.  The study is normal.  This is a low risk study.  Overall low counts intracardiac with significant extracardiac activity, may limit evaluation. No apparent reversible ischemia. ST changes during infusion of lexiscan.   ECHOCARDIOGRAM  ECHOCARDIOGRAM LIMITED BUBBLE STUDY 09/20/2020  Narrative ECHOCARDIOGRAM LIMITED REPORT    Patient Name:   Mercedes Barry Date of Exam: 09/20/2020 Medical Rec #:  [id number]          Height:       57.0 in Accession #:    [id number]         Weight:       129.0 lb Date of Birth:  10-Dec-1975           BSA:          1.493 m Patient Age:    45 years           BP:           140/82 mmHg Patient Gender: F                  HR:           96 bpm. Exam Location:  [address]  Procedure: Limited Echo and Saline Contrast  Bubble Study  Indications:     R01.1 Murmur  History:         Patient has prior history of Echocardiogram examinations, most recent 08/19/2020. Signs/Symptoms:Murmur; Risk Factors:Hypertension. Dilated Coronary Sinus- Rule out Persistent Left SVC, Turner's Syndrome.  Sonographer:     Farrel Conners RDCS Referring Phys:  Rito Ehrlich [facility] Diagnosing Phys: Kristeen Miss MD   Sonographer Comments: IV inserted into Left AC with Bubble injection to rule out Persistent Left SVC IMPRESSIONS   1. The coronary sinus does appear prominant. 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 The images are somewhat difficult because of multiple bubble injections and suboptimal image quality.  FINDINGS Left Atrium: Left atrial size was normal  in size.  IAS/Shunts: No atrial level shunt detected by color flow Doppler. Agitated saline contrast was given intravenously to evaluate for intracardiac shunting.  Kristeen Miss MD Electronically signed by Kristeen Miss MD Signature Date/Time: 09/20/2020/3:26:56 PM    Final (Updated)             Risk Assessment/Calculations:             Physical Exam:   VS:  BP 122/74   Pulse 86   Ht 4\' 9"  (1.448 m)   Wt 140 lb (63.5 kg)   BMI 30.30 kg/m    Wt Readings from Last 3 Encounters:  08/20/22 140 lb (63.5 kg)  08/15/22 137 lb 6.4 oz (62.3 kg)  01/25/22 139 lb 9.6 oz (63.3 kg)    GEN: Well nourished, well developed in no acute distress NECK: No JVD; No carotid bruits CARDIAC: RRR, no murmurs, rubs, gallops RESPIRATORY:  Clear to auscultation without rales, wheezing or rhonchi  ABDOMEN: Soft, non-tender, non-distended EXTREMITIES:  No edema; No deformity   ASSESSMENT AND PLAN: .    CAD- Stable with no anginal symptoms. No indication for ischemic evaluation.  EKG today NSR with no acute ST/T wave changes.  GDMT includes rosuvastatin 10 mg daily. Recommend aiming for 150 minutes of moderate intensity activity per week and following a heart healthy diet.    HLD, LDL goal less than 70 - 08/07/22 LDL 57, normal liver enzymes. Continue Rosuvastatin 10mg  every day.  Refill provided  HTN- BP well controlled. Continue current antihypertensive regimen Lisinopril 10mg  daily.   Subclavian artery dilation (1.7 cm) - stable 07/2021 and 07/2022. Repeat CT in two years for monitoring.  To be ordered at follow up. Continue optimal BP control.   DM2 - Hopeful to manage through diet/exercise. Presently on Metformin. Provided resources for low carbohydrate diet and increasing physical activity.        Dispo: follow up in one year  Signed, Alver Sorrow, NP

## 2022-09-26 ENCOUNTER — Ambulatory Visit
Admission: EM | Admit: 2022-09-26 | Discharge: 2022-09-26 | Disposition: A | Discharge status: Home / Self Care | Payer: BC Managed Care – PPO | Attending: Emergency Medicine | Admitting: Emergency Medicine

## 2022-09-26 DIAGNOSIS — H60331 Swimmer's ear, right ear: Secondary | ICD-10-CM | POA: Diagnosis not present

## 2022-09-26 MED ORDER — NEOMYCIN-POLYMYXIN-HC 3.5-10000-1 OT SUSP: 4.0000 [drp] | Freq: Four times a day (QID) | OTIC | 0 refills | Status: AC

## 2022-09-26 NOTE — ED Provider Notes (Signed)
Renaldo Fiddler    CSN: [id number] Arrival date & time: 09/26/22  6440      History   Chief Complaint Chief Complaint  Patient presents with   Otalgia    HPI Mercedes Barry is a 47 y.o. female.   47 year old female, Mercedes Barry, presents to urgent care for evaluation of right sided ear pain x 2-3 days. Pt wears hearing aids. Pt denies any known trauma or injury.  The history is provided by the patient. No language interpreter was used.    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   Seasonal allergies 03/23/2014   Subclavian aneurysm (HCC) 03/02/2021   Thyroid activity decreased 02/02/2014   Turner's syndrome 09/25/2013   Turner's syndrome    Patient Active Problem List   Diagnosis Date Noted   Acute swimmer's ear of right side 09/26/2022   History of Clostridioides difficile colitis 07/21/2021   Subclavian aneurysm (HCC) 03/02/2021   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   CAD in native artery 11/09/2020   Cardiac murmur 08/17/2020   Hypothyroidism 08/15/2020   Osteoporosis 08/15/2020   Abnormal weight gain 08/15/2020   Family history of malignant neoplasm of digestive organs 08/15/2020   Obesity 08/15/2020   Right upper quadrant pain 08/15/2020   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 Authorizing Provider  neomycin-polymyxin-hydrocortisone (CORTISPORIN) 3.5-10000-1 OTIC suspension Place 4 drops into the right ear 4 (four) times daily for 7 days. 09/26/22 10/03/22 Yes Giovanni Biby, Para March, NP  estradiol-norethindrone (ACTIVELLA) 1-0.5 MG tablet Take 1 tablet by mouth daily. 08/16/22   Jerene Bears, MD  levothyroxine (SYNTHROID, LEVOTHROID) 112 MCG tablet Take 1 tablet (112 mcg total) by mouth daily before breakfast. 07/06/14   Sherren Mocha, MD  lisinopril (PRINIVIL,ZESTRIL) 10 MG tablet Take 1 tablet by mouth daily. 01/21/17   [provider]  metFORMIN (GLUCOPHAGE-XR) 500 MG 24 hr tablet Take 500 mg by mouth daily. 08/03/21   [provider]  rosuvastatin (CRESTOR) 10 MG tablet Take 1 tablet (10 mg total) by mouth daily. 08/20/22   Alver Sorrow, NP    Family History Family History  Problem Relation Age of Onset   Osteoporosis Mother    Breast cancer Mother 43       diagnosed 2019   Colon cancer Brother  38       unsure if had genetic testing   Breast cancer Maternal Grandmother 9    Social History Social History   Tobacco Use   Smoking status: Never   Smokeless tobacco: Never  Vaping Use   Vaping status: Never Used  Substance Use Topics   Alcohol use: No   Drug use: No     Allergies   Amoxicillin-pot clavulanate, Cefprozil, Entex, Penicillins, and Cephalosporins   Review of Systems Review of Systems  Constitutional:  Negative for fever.  HENT:  Positive for ear pain. Negative for facial swelling.   All other systems reviewed and are negative.    Physical Exam Triage Vital Signs ED Triage Vitals  Encounter Vitals Group     BP 09/26/22 1000 134/80     Systolic BP Percentile --      Diastolic BP Percentile --      Pulse Rate 09/26/22 1000 82      Resp 09/26/22 1000 18     Temp 09/26/22 1000 97.7 F (36.5 C)     Temp src --      SpO2 09/26/22 1000 98 %     Weight --      Height --      Head Circumference --      Peak Flow --      Pain Score 09/26/22 0954 6     Pain Loc --      Pain Education --      Exclude from Growth Chart --    No data found.  Updated Vital Signs BP 134/80   Pulse 82   Temp 97.7 F (36.5 C)   Resp 18   LMP 09/04/2022   SpO2 98%   Visual Acuity Right Eye Distance:   Left Eye Distance:   Bilateral Distance:    Right Eye Near:   Left Eye Near:    Bilateral Near:     Physical Exam Vitals and nursing note reviewed.  Constitutional:      Appearance: Normal appearance. She is well-developed and well-groomed.  HENT:     Head: Normocephalic.     Right Ear: Swelling and tenderness present.     Left Ear: No tenderness.  Cardiovascular:     Rate and Rhythm: Normal rate and regular rhythm.     Pulses: Normal pulses.     Heart sounds: Normal heart sounds.  Pulmonary:     Effort: Pulmonary effort is normal.     Breath sounds: Normal breath sounds and air entry.  Neurological:     General: No focal deficit present.     Mental Status: She is alert and oriented to person, place, and time.     GCS: GCS eye subscore is 4. GCS verbal subscore is 5. GCS motor subscore is 6.  Psychiatric:        Attention and Perception: Attention normal.        Mood and Affect: Mood normal.        Speech: Speech normal.        Behavior: Behavior normal. Behavior is cooperative.      UC Treatments / Results  Labs (all labs ordered are listed, but only abnormal results are displayed) Labs Reviewed - No data to display  EKG   Radiology No results found.  Procedures Procedures (including critical care time)  Medications Ordered in UC Medications - No data to display  Initial Impression / Assessment and Plan / UC Course  I have reviewed the  triage vital signs and the nursing notes.  Pertinent labs  & imaging results that were available during my care of the patient were reviewed by me and considered in my medical decision making (see chart for details).    Discussed exam findings and plan of care with patient, strict go to ER precautions given.   Patient verbalized understanding to this provider.  Ddx:AOE, AOM,allergies Final Clinical Impressions(s) / UC Diagnoses   Final diagnoses:  Acute swimmer's ear of right side     Discharge Instructions      Do not get water in your ears. Use ear drops as directed. Follow up with PCP, if symptoms persist-follow up with ENT specialist of choice or Dr. Elenore Rota. May use over the counter tylenol/ibuprofen as label directed for weight based dosing     ED Prescriptions     Medication Sig Dispense Auth. Provider   neomycin-polymyxin-hydrocortisone (CORTISPORIN) 3.5-10000-1 OTIC suspension Place 4 drops into the right ear 4 (four) times daily for 7 days. 10 mL Jaiyah Beining, Para March, NP      PDMP not reviewed this encounter.   Clancy Gourd, NP 09/26/22 1736

## 2022-09-26 NOTE — ED Triage Notes (Signed)
Patient to Urgent Care with complaints of right sided ear pain that started two days ago. Has felt a lot of drainage. Denies any known fevers.  Wears hearing aids. Woke up this morning with worsening pain that radiates into her jaw. Difficult to eat.

## 2022-09-26 NOTE — Discharge Instructions (Signed)
Do not get water in your ears. Use ear drops as directed. Follow up with PCP, if symptoms persist-follow up with ENT specialist of choice or Dr. Elenore Rota. May use over the counter tylenol/ibuprofen as label directed for weight based dosing

## 2022-09-28 ENCOUNTER — Telehealth: Payer: Self-pay

## 2022-09-28 NOTE — Telephone Encounter (Signed)
Pt called wanting to know if she could get oral antibiotics prescribed for possible ear infection. Pt was seen 09/26/22 and prescribed ear drops and states her ear is still hurting and her ENT stated she needed to have oral antibiotics. Spoke with provider here Tresa Endo T. and she stated the pt will need to be re-seen, we are unable to send in antibiotics without seeing her ear at this time.

## 2022-12-09 IMAGING — CT CT CHEST W/O CM
2 of 3 series · 15 of 36 positions shown, 18 images · non-contrast
Comparison: CT abdomen pelvis 08/16/2015.

CLINICAL DATA: Turner syndrome.

EXAM:
CT CHEST WITHOUT CONTRAST
TECHNIQUE: Multidetector CT imaging of the chest was performed following the
standard protocol without IV contrast.

[Series 2: thorax · axial · 0.65mm/px · z∈[+724,+966]mm · 12 of 143 slices shown, 15 images]
[im 11/143  mediastinal]
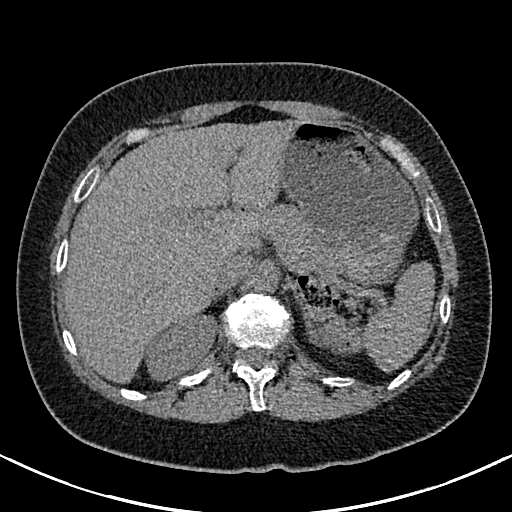
[im 11/143  lung]
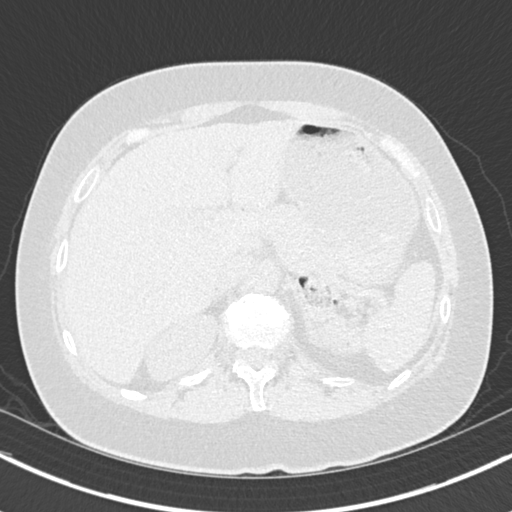
[im 22/143  lung]
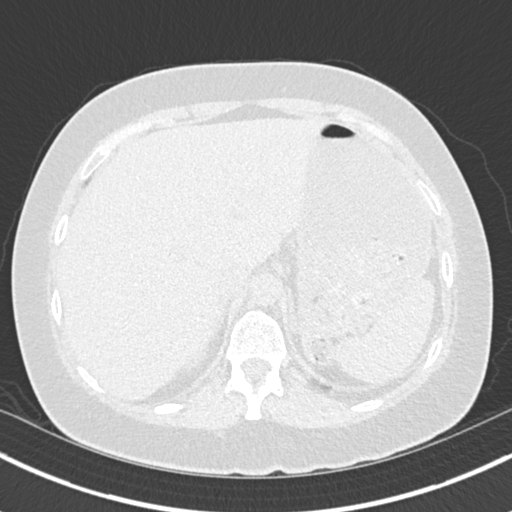
[im 32/143  lung]
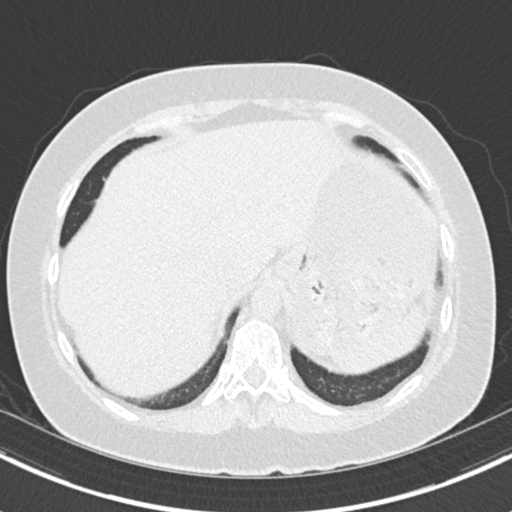
[im 43/143  lung]
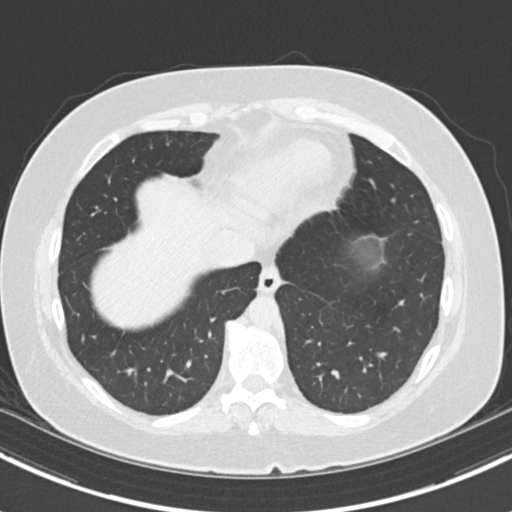
[im 53/143  mediastinal]
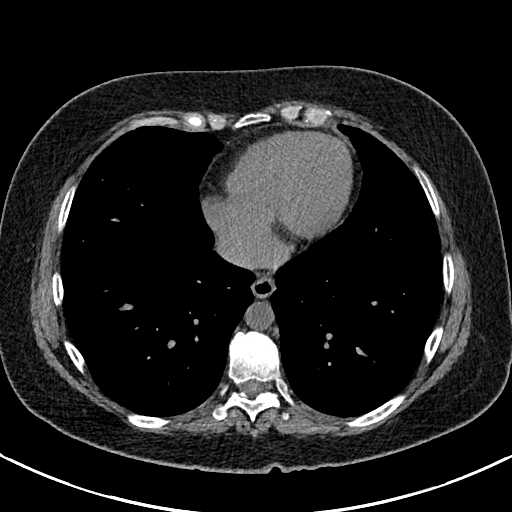
[im 53/143  lung]
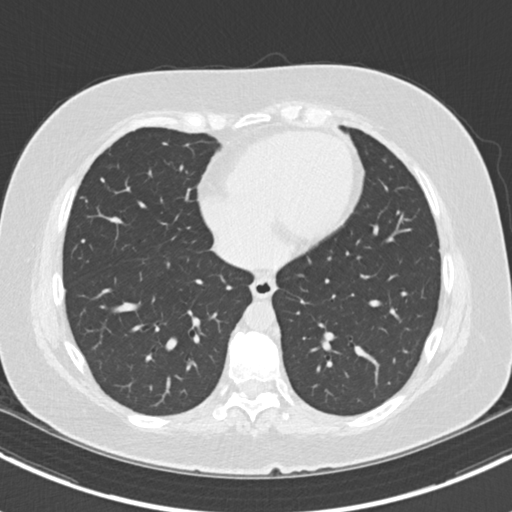
[im 64/143  lung]
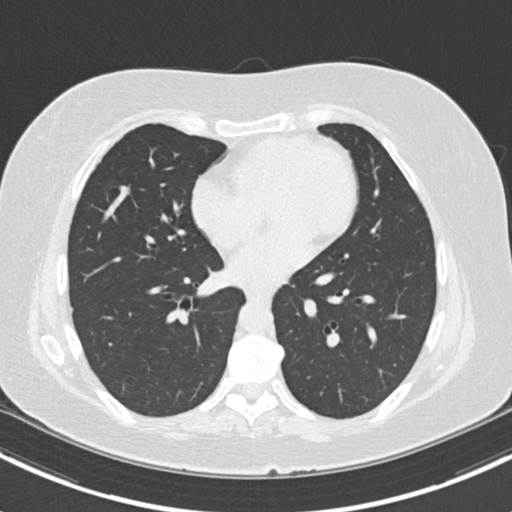
[im 79/143  lung]
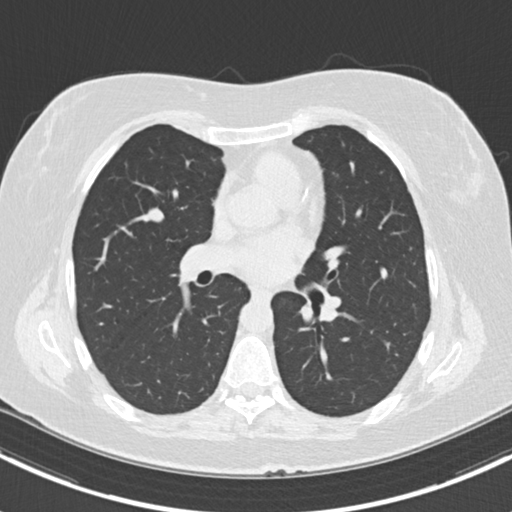
[im 90/143  lung]
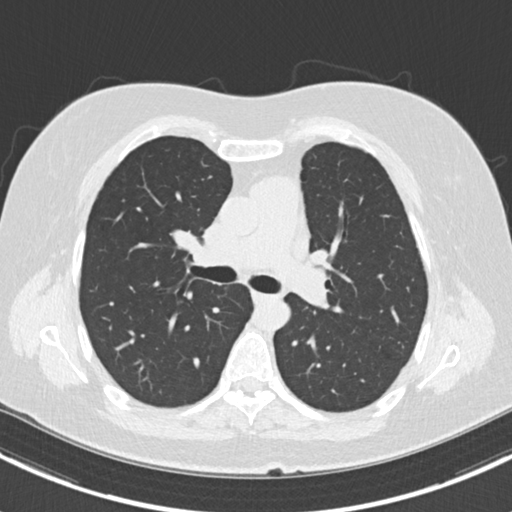
[im 100/143  mediastinal]
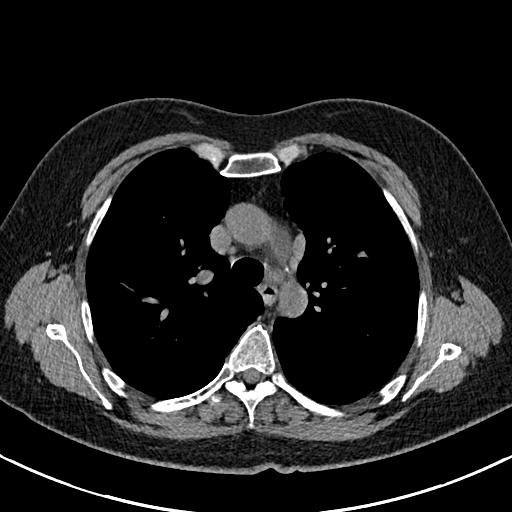
[im 100/143  lung]
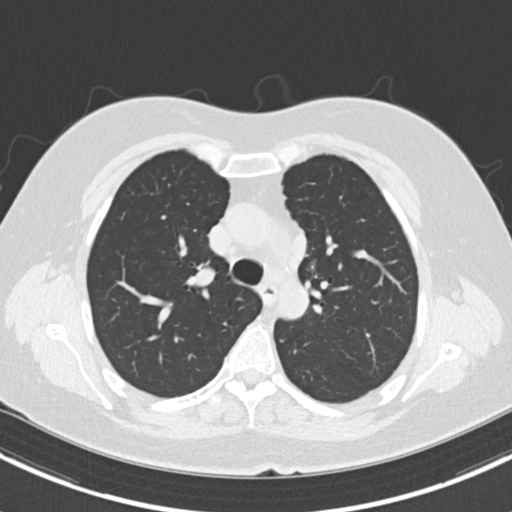
[im 111/143  lung]
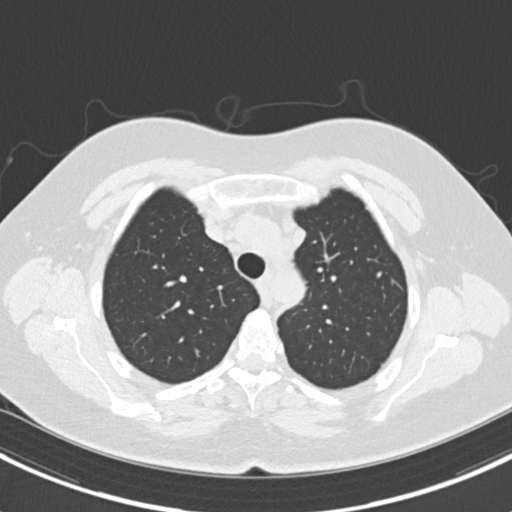
[im 121/143  lung]
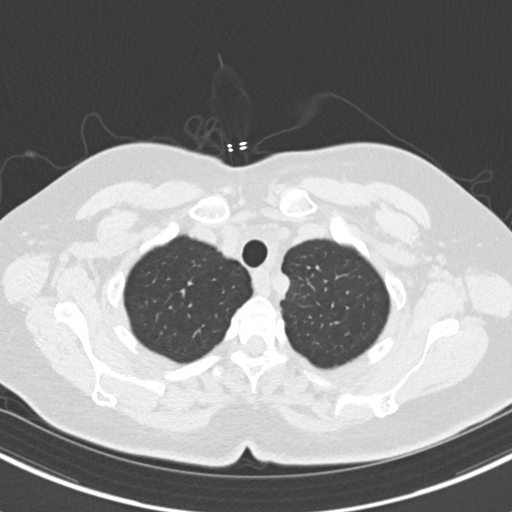
[im 132/143  lung]
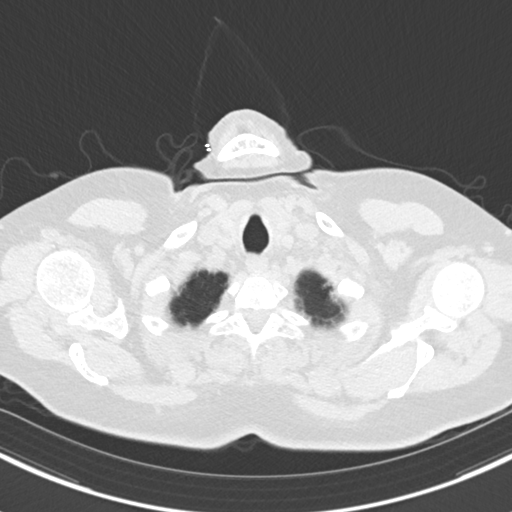

[Series 5: coronal · coronal · 0.59mm/px · 3 of 142 slices shown]
[im 29/142  lung]
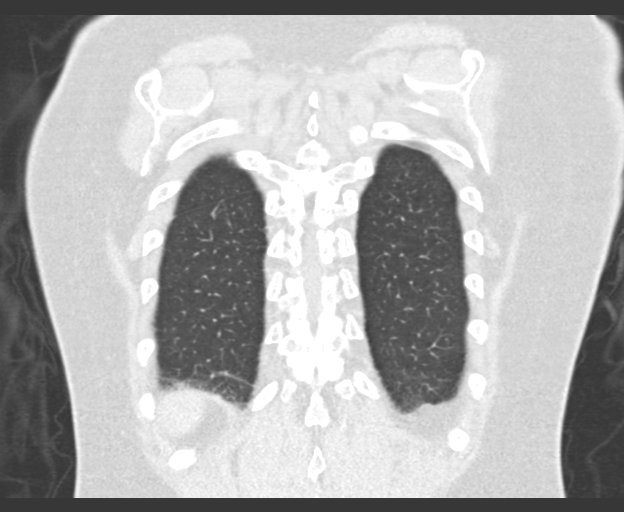
[im 57/142  lung]
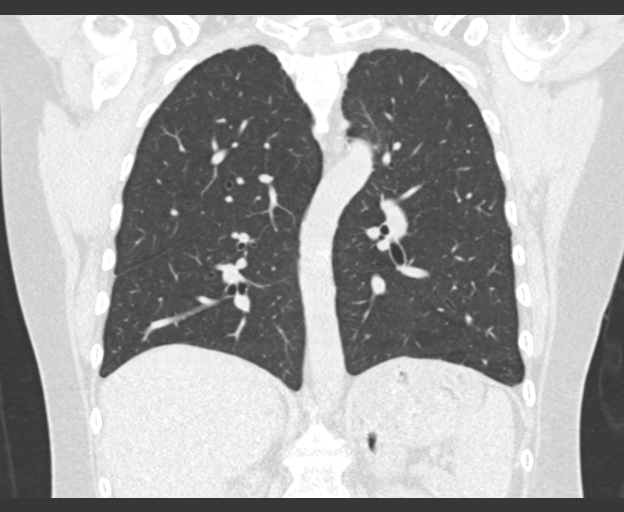
[im 85/142  lung]
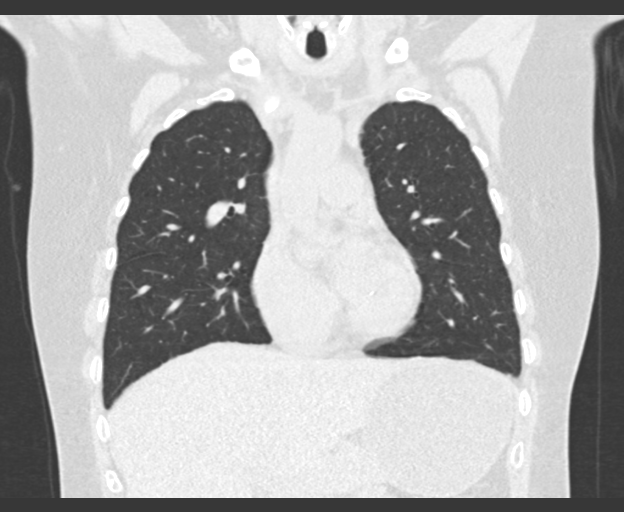

[15 of 36 positions shown; findings below may reference images not displayed]

FINDINGS: Cardiovascular: Coronary artery calcification. Heart size normal. No
pericardial effusion.

Mediastinum/Nodes: No pathologically enlarged mediastinal or
axillary lymph nodes. Hilar regions are difficult to evaluate
without IV contrast. Esophagus is grossly unremarkable.

Lungs/Pleura: Mild biapical pleuroparenchymal scarring. Minimal
basilar scarring. Lungs are otherwise clear. No pleural fluid.
Airway is unremarkable.

Upper Abdomen: Vague area of incompletely imaged low attenuation in
the left hepatic lobe measures approximately 4.0 cm, previously
cm and interpreted as a hemangioma on 08/16/2015. There may be
additional subcentimeter low attenuations in the liver, too small to
characterize. Visualized portions of the adrenal glands, kidneys,
spleen, pancreas, stomach and bowel are otherwise unremarkable.

Musculoskeletal: Degenerative changes in the spine. No worrisome
lytic or sclerotic lesions.
IMPRESSION: 1. No acute findings.
2. Partially imaged vague low-attenuation lesion in the left hepatic
lobe, slightly larger than on 08/16/2015 but characterized as a
hemangioma on that study.
3. Coronary artery calcification.

## 2023-02-19 ENCOUNTER — Encounter (HOSPITAL_BASED_OUTPATIENT_CLINIC_OR_DEPARTMENT_OTHER): Payer: Self-pay | Admitting: Obstetrics & Gynecology

## 2023-02-19 ENCOUNTER — Ambulatory Visit (HOSPITAL_BASED_OUTPATIENT_CLINIC_OR_DEPARTMENT_OTHER): Payer: 59 | Admitting: Obstetrics & Gynecology

## 2023-02-19 VITALS — BP 136/84 | HR 99 | Ht <= 58 in | Wt 132.6 lb

## 2023-02-19 DIAGNOSIS — N926 Irregular menstruation, unspecified: Secondary | ICD-10-CM

## 2023-02-19 DIAGNOSIS — Q969 Turner's syndrome, unspecified: Secondary | ICD-10-CM | POA: Diagnosis not present

## 2023-02-19 DIAGNOSIS — I10 Essential (primary) hypertension: Secondary | ICD-10-CM | POA: Diagnosis not present

## 2023-02-19 MED ORDER — ESTRADIOL 1 MG PO TABS
1.0000 mg | ORAL_TABLET | Freq: Every day | ORAL | 3 refills | Status: DC
Start: 2023-02-19 — End: 2023-06-14

## 2023-02-19 MED ORDER — SLYND 4 MG PO TABS
1.0000 | ORAL_TABLET | Freq: Every day | ORAL | 2 refills | Status: DC
Start: 2023-02-19 — End: 2023-05-06

## 2023-02-19 NOTE — Progress Notes (Signed)
GYNECOLOGY  VISIT  CC:   irregular bleeding  HPI: 48 y.o. G0P0000 [name] here for complaint of irregular bleeding this last month.  Had bleeding episode that lasted over two weeks. Has stopped now.  Was on combination OCPs but has hypertension.  This was changed to activella 1.0mg  dosage.  She did have an FSH that was 44 prior to this transition.  Did well until December.  Bleeding was never heavy, just lasted.  Denies SOB, palpitations.  Is in newer relationship that is very good.  She is SA and this has been less painful than she was expecting.  Really happy with the changes.  He lives closer to [facility].  Discussed treatment options.  May need stronger progesterone so could switch to [facility] and use estradiol 1.0mg .  Feel should already return for ultrasound.  Pt comfortable with plan.  Will finish current prescription and then transition, since bleeding has stopped.  Last pap smear 07/21/2021, negative with neg HR HPV.   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 90 tablet 3   lisinopril (PRINIVIL,ZESTRIL) 10 MG tablet Take 1 tablet by mouth daily.  0   metFORMIN (GLUCOPHAGE-XR) 500 MG 24 hr tablet Take 500 mg by mouth daily.      rosuvastatin (CRESTOR) 10 MG tablet Take 1 tablet (10 mg total) by mouth daily. 90 tablet 3   Current Facility-Administered Medications on File Prior to Visit  Medication Dose Route Frequency Provider Last Rate Last Admin   sodium chloride flush (NS) 0.9 % injection 10 mL  10 mL Intravenous PRN Revankar, Aundra Dubin, MD   10 mL at 09/20/20 1320    ALLERGIES: Amoxicillin-pot clavulanate, Cefprozil, Entex, Penicillins, and Cephalosporins  SH:  single, non smoker  Review of Systems  Constitutional: Negative.   Genitourinary:        Irregular bleeding    PHYSICAL EXAMINATION:    BP 136/84 (BP Location: Right Arm, Patient Position: Sitting, Cuff Size: Normal)   Pulse 99   Ht 4\' 9"  (1.448 m) Comment: Reported  Wt 132 lb 9.6 oz (60.1 kg)   BMI 28.69 kg/m     General appearance: alert, cooperative and appears stated age CV:  Regular rate and rhythm Lungs:  clear to auscultation, no wheezes, rales or rhonchi, symmetric air entry  Assessment/Plan: 1. Irregular bleeding (Primary) - will switch to slynd and oral 1.0mg  estradiol daily.  Rx to pharamcy.  She will return for u/s.  Order palced - Drospirenone (SLYND) 4 MG TABS; Take 1 tablet (4 mg total) by mouth daily.  Dispense: 28 tablet; Refill: 2 - estradiol (ESTRACE) 1 MG tablet; Take 1 tablet (1 mg total) by mouth daily.  Dispense: 30 tablet; Refill: 3 -  US PELVIS TRANSVAGINAL NON-OB (TV ONLY); Future  2. Essential hypertension  3. Turner's syndrome

## 2023-02-22 DIAGNOSIS — E119 Type 2 diabetes mellitus without complications: Secondary | ICD-10-CM | POA: Insufficient documentation

## 2023-02-27 ENCOUNTER — Ambulatory Visit (HOSPITAL_BASED_OUTPATIENT_CLINIC_OR_DEPARTMENT_OTHER): Payer: 59 | Admitting: Obstetrics & Gynecology

## 2023-02-27 ENCOUNTER — Ambulatory Visit (HOSPITAL_BASED_OUTPATIENT_CLINIC_OR_DEPARTMENT_OTHER): Payer: 59

## 2023-02-27 VITALS — BP 118/82 | HR 88 | Ht <= 58 in | Wt 133.4 lb

## 2023-02-27 DIAGNOSIS — N926 Irregular menstruation, unspecified: Secondary | ICD-10-CM | POA: Diagnosis not present

## 2023-02-27 DIAGNOSIS — Q969 Turner's syndrome, unspecified: Secondary | ICD-10-CM | POA: Diagnosis not present

## 2023-03-02 ENCOUNTER — Encounter (HOSPITAL_BASED_OUTPATIENT_CLINIC_OR_DEPARTMENT_OTHER): Payer: Self-pay | Admitting: Obstetrics & Gynecology

## 2023-03-02 NOTE — Progress Notes (Signed)
 GYNECOLOGY  VISIT  CC:   Discuss ultrasound results, irregular bleeding  HPI: 48 y.o. G0P0000 [name] here for discussion of ultrasound results.  Ultrasound obtained due to irregular bleeding after transitioning to HRT. She has stopped bleeding.  Ultrasound today showed endometrium 5-25mm with possible small clot present.  No ovarian tissue seen.  As bleeding has stopped, will plan to monitor.  If irregular bleeding returns, will plan to change progesterone to promethium with two weeks on/two weeks off dosing.     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 28 tablet 2   estradiol  (ESTRACE ) 1 MG tablet Take 1 tablet (1 mg total) by mouth daily. 30 tablet 3   levothyroxine  (SYNTHROID , LEVOTHROID) 112 MCG tablet Take 1 tablet (112 mcg total) by mouth daily before breakfast. 90 tablet 3   lisinopril  (PRINIVIL ,ZESTRIL ) 10 MG tablet Take 1 tablet by mouth daily.  0   metFORMIN (GLUCOPHAGE-XR) 500 MG 24 hr tablet Take 500 mg by mouth daily.     rosuvastatin  (CRESTOR ) 10 MG tablet Take 1 tablet (10 mg total) by mouth daily. 90 tablet 3   Current Facility-Administered Medications on File Prior to Visit  Medication Dose Route Frequency Provider Last Rate Last Admin   sodium chloride  flush  (NS) 0.9 % injection 10 mL  10 mL Intravenous PRN Revankar, Rajan R, MD   10 mL at 09/20/20 1320    ALLERGIES: Amoxicillin-pot clavulanate, Cefprozil, Entex, Penicillins, and Cephalosporins  SH:  single, non smoker  Review of Systems  Constitutional: Negative.   Genitourinary:        Irregular bleeding    PHYSICAL EXAMINATION:    BP 118/82 (BP Location: Left Arm, Patient Position: Sitting, Cuff Size: Normal)   Pulse 88   Ht 4' 9 (1.448 m)   Wt 133 lb 6.4 oz (60.5 kg)   BMI 28.87 kg/m      Physical Exam Constitutional:      Appearance: Normal appearance.  Neurological:     General: No focal deficit present.     Mental Status: She is alert.  Psychiatric:        Mood and Affect: Mood normal.      Assessment/Plan: 1. Irregular bleeding (Primary) - ultrasound reassuring.  Will monitor for now and she knows to call with any new irregular bleeding.  Would change progesterone dosing at that time  2. Turner's syndrome

## 2023-03-11 ENCOUNTER — Encounter (HOSPITAL_BASED_OUTPATIENT_CLINIC_OR_DEPARTMENT_OTHER): Payer: Self-pay | Admitting: Obstetrics & Gynecology

## 2023-03-19 ENCOUNTER — Telehealth: Payer: Self-pay | Admitting: Cardiovascular Disease

## 2023-03-19 NOTE — Telephone Encounter (Signed)
 Pt calling stating that she is having redness in both hands predominantly left x 1 mo that has not improved. Denies any other symptoms besides minimal itching. Tries lotion with no relief. Requesting cb to see if ntbs

## 2023-03-19 NOTE — Telephone Encounter (Signed)
 Spoke with patient regarding redness in hands Uses a variety of different creams, no new meds  Advised to reach out to PCP, verbalized understanding

## 2023-05-06 ENCOUNTER — Other Ambulatory Visit (HOSPITAL_BASED_OUTPATIENT_CLINIC_OR_DEPARTMENT_OTHER): Payer: Self-pay | Admitting: *Deleted

## 2023-05-06 ENCOUNTER — Encounter (HOSPITAL_BASED_OUTPATIENT_CLINIC_OR_DEPARTMENT_OTHER): Payer: Self-pay | Admitting: Obstetrics & Gynecology

## 2023-05-06 DIAGNOSIS — N926 Irregular menstruation, unspecified: Secondary | ICD-10-CM

## 2023-05-06 MED ORDER — SLYND 4 MG PO TABS: 1.0000 | ORAL_TABLET | Freq: Every day | ORAL | Status: DC

## 2023-05-06 MED ORDER — SLYND 4 MG PO TABS
1.0000 | ORAL_TABLET | Freq: Every day | ORAL | 1 refills | Status: DC
Start: 2023-05-06 — End: 2023-07-11

## 2023-05-29 NOTE — Addendum Note (Signed)
 Addended by: Guss Legacy on: 05/29/2023 02:45 PM   Modules accepted: Orders

## 2023-06-14 ENCOUNTER — Other Ambulatory Visit (HOSPITAL_BASED_OUTPATIENT_CLINIC_OR_DEPARTMENT_OTHER): Payer: Self-pay

## 2023-06-14 DIAGNOSIS — N926 Irregular menstruation, unspecified: Secondary | ICD-10-CM

## 2023-06-14 MED ORDER — ESTRADIOL 1 MG PO TABS: 1.0000 mg | ORAL_TABLET | Freq: Every day | ORAL | 3 refills | Status: DC

## 2023-07-11 ENCOUNTER — Telehealth (HOSPITAL_BASED_OUTPATIENT_CLINIC_OR_DEPARTMENT_OTHER): Payer: Self-pay | Admitting: *Deleted

## 2023-07-11 ENCOUNTER — Encounter (HOSPITAL_BASED_OUTPATIENT_CLINIC_OR_DEPARTMENT_OTHER): Payer: Self-pay | Admitting: Obstetrics & Gynecology

## 2023-07-11 DIAGNOSIS — N926 Irregular menstruation, unspecified: Secondary | ICD-10-CM

## 2023-07-11 MED ORDER — SLYND 4 MG PO TABS: 1.0000 | ORAL_TABLET | Freq: Every day | ORAL | 1 refills | Status: DC

## 2023-07-11 NOTE — Telephone Encounter (Signed)
 TC pt informed Rx sent to [facility] pharmacy for Slynd  4mg  Terri Fester CMA

## 2023-07-15 ENCOUNTER — Ambulatory Visit (HOSPITAL_BASED_OUTPATIENT_CLINIC_OR_DEPARTMENT_OTHER): Payer: 59 | Admitting: Obstetrics & Gynecology

## 2023-07-22 ENCOUNTER — Ambulatory Visit (HOSPITAL_BASED_OUTPATIENT_CLINIC_OR_DEPARTMENT_OTHER): Admitting: Certified Nurse Midwife

## 2023-07-22 ENCOUNTER — Encounter (HOSPITAL_BASED_OUTPATIENT_CLINIC_OR_DEPARTMENT_OTHER): Payer: Self-pay | Admitting: Certified Nurse Midwife

## 2023-07-22 VITALS — BP 124/78 | HR 87 | Ht <= 58 in | Wt 137.4 lb

## 2023-07-22 DIAGNOSIS — N926 Irregular menstruation, unspecified: Secondary | ICD-10-CM

## 2023-07-22 DIAGNOSIS — Z1231 Encounter for screening mammogram for malignant neoplasm of breast: Secondary | ICD-10-CM

## 2023-07-22 MED ORDER — ESTRADIOL 1 MG PO TABS
1.0000 mg | ORAL_TABLET | Freq: Every day | ORAL | 3 refills | Status: AC
Start: 2023-07-22 — End: ?

## 2023-07-22 MED ORDER — SLYND 4 MG PO TABS
1.0000 | ORAL_TABLET | Freq: Every day | ORAL | 4 refills | Status: AC
Start: 2023-07-22 — End: ?

## 2023-07-22 NOTE — Progress Notes (Signed)
  Subjective:     Mercedes Barry is a 48 y.o. Engaged female here for problem visit. She has moved to [facility] and needs Estradiol  and Slynd  sent to new pharmacy [facility]. Pt states she has not had vaginal spotting or bleeding in 4 months. Pt states there is no risk of pregnancy. No risk of STI exposure.   They are planning a wedding and honeymoon to [place]  (cruise).    The following portions of the patient's history were reviewed and updated as appropriate: allergies, current medications, past family history, past medical history, past social history, past surgical history, and problem list.   Review of Systems Pertinent items are noted in HPI.    Objective:    BP 124/78   Pulse 87   Ht 4' 9 (1.448 m) Comment: Reported  Wt 137 lb 6.4 oz (62.3 kg)   BMI 29.73 kg/m  General appearance: alert, cooperative, and appears stated age    Assessment:    Hx Irregular Bleeding.    Plan:    Pt reassured that she may continue Estradiol  and Slynd . Reassured that amenorrhea is within normal at this time.  Refill of medications sent as requested She will RTO September for annual gyn exam Continue annual screening mammogram.  Arland MARLA Roller

## 2023-08-16 ENCOUNTER — Ambulatory Visit (HOSPITAL_BASED_OUTPATIENT_CLINIC_OR_DEPARTMENT_OTHER): Admitting: Family

## 2023-08-26 ENCOUNTER — Ambulatory Visit (HOSPITAL_BASED_OUTPATIENT_CLINIC_OR_DEPARTMENT_OTHER): Admitting: Obstetrics & Gynecology

## 2023-08-29 ENCOUNTER — Ambulatory Visit (HOSPITAL_BASED_OUTPATIENT_CLINIC_OR_DEPARTMENT_OTHER): Admitting: Obstetrics & Gynecology

## 2023-09-15 ENCOUNTER — Other Ambulatory Visit (HOSPITAL_BASED_OUTPATIENT_CLINIC_OR_DEPARTMENT_OTHER): Payer: Self-pay | Admitting: Family

## 2023-09-15 DIAGNOSIS — E785 Hyperlipidemia, unspecified: Secondary | ICD-10-CM

## 2023-10-07 ENCOUNTER — Ambulatory Visit (HOSPITAL_BASED_OUTPATIENT_CLINIC_OR_DEPARTMENT_OTHER): Admitting: Family

## 2023-10-08 ENCOUNTER — Encounter (HOSPITAL_BASED_OUTPATIENT_CLINIC_OR_DEPARTMENT_OTHER): Payer: Self-pay | Admitting: Obstetrics & Gynecology

## 2023-10-08 ENCOUNTER — Ambulatory Visit (INDEPENDENT_AMBULATORY_CARE_PROVIDER_SITE_OTHER): Admitting: Obstetrics & Gynecology

## 2023-10-08 VITALS — BP 130/87 | HR 96 | Ht <= 58 in | Wt 144.0 lb

## 2023-10-08 DIAGNOSIS — E119 Type 2 diabetes mellitus without complications: Secondary | ICD-10-CM | POA: Diagnosis not present

## 2023-10-08 DIAGNOSIS — Q969 Turner's syndrome, unspecified: Secondary | ICD-10-CM

## 2023-10-08 DIAGNOSIS — Z1331 Encounter for screening for depression: Secondary | ICD-10-CM

## 2023-10-08 DIAGNOSIS — Z01419 Encounter for gynecological examination (general) (routine) without abnormal findings: Secondary | ICD-10-CM | POA: Diagnosis not present

## 2023-10-08 DIAGNOSIS — E039 Hypothyroidism, unspecified: Secondary | ICD-10-CM

## 2023-10-08 DIAGNOSIS — I10 Essential (primary) hypertension: Secondary | ICD-10-CM

## 2023-10-08 DIAGNOSIS — Z7984 Long term (current) use of oral hypoglycemic drugs: Secondary | ICD-10-CM

## 2023-10-08 NOTE — Progress Notes (Signed)
 ANNUAL EXAM Patient name: Mercedes Barry MRN [id number]  Date of birth: [birth year] Chief Complaint:   Annual Exam  History of Present Illness:   Mercedes Barry is a 47 y.o. G0P0000 Caucasian female being seen today for a routine annual exam.  Getting a married in late March.  Living in [place] with fiance.  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  Has a dentist appointment scheduled.  Planning on returning here for gyn care.  Planning on returning here for cardiology care.   Followed by cardiology.  Last imaging 08/11/2022.  Repeat imaging recommended in 1-2 years.  Cariology appt scheduled Friday.    Does blood work with Dr. Burney.    Has not had any bleeding since switching to Slynd  and Estradiol .  Has been SA.     No LMP recorded. (Menstrual status: Oral contraceptives).   Last pap 07/21/2021. Results were: NILM w/ HRHPV negative. H/O abnormal pap: no Last mammogram: 04/25/2022. Results were: normal. Family h/o breast cancer: yes mom and maternal grandmother Last colonoscopy: 07/29/2019. Results were: abnormal polyp. Family h/o colorectal cancer: yes brother with colon cancer.  Follow up 5 years.   DEXA:  01/10/2021.       10/08/2023    3:35 PM 07/22/2023   11:09 AM 02/19/2023    4:11 PM 08/15/2022    1:48 PM 01/25/2022    3:28 PM  Depression screen PHQ 2/9  Decreased Interest 0 0 0 0 0  Down, Depressed, Hopeless 0 0 0 0 0  PHQ - 2 Score 0 0 0 0 0    Review of Systems:   Pertinent items are noted in HPI Denies any urinary or bowel changes.  Denies pelvic pain.   Pertinent History Reviewed:  Reviewed past medical,surgical, social and family history.  Reviewed problem list, medications and allergies. Physical Assessment:   Vitals:   10/08/23 1531  BP: 130/87  Pulse: 96  Weight: 144 lb (65.3 kg)  Height: 4' 9 (1.448 m)  Body mass index is 31.16 kg/m.        Physical Examination:   General appearance - well appearing, and in no  distress  Mental status - alert, oriented to person, place, and time  Psych:  She has a normal mood and affect  Skin - warm and dry, normal color, no suspicious lesions noted  Chest - effort normal, all lung fields clear to auscultation bilaterally  Heart - normal rate and regular rhythm  Neck:  midline trachea, no thyromegaly or nodules  Breasts - breasts appear normal, no suspicious masses, no skin or nipple changes or  axillary nodes  Abdomen - soft, nontender, nondistended, no masses or organomegaly  Pelvic - VULVA: normal appearing vulva with no masses, tenderness or lesions   Declined internal exam  Thin prep pap is not indicated  Rectal - deferred  Extremities:  No swelling or varicosities noted  Chaperone present for exam  No results found for this or any previous visit (from the past 24 hours).  Assessment & Plan:  1. Well woman exam with routine gynecological exam (Primary) - Pap smear 2023 - Mammogram scheduled tomorrow - Colonoscopy 2021 - Bone mineral density 2022.  Will follow up with Dr. Burney about this - lab work done is typically done with Dr. Burney - vaccines reviewed/updated  2. Turner's syndrome  3. Type 2 diabetes mellitus without complication, without long-term current use of insulin (HCC) - on Metformin  4. Acquired hypothyroidism - on levothyroxine   5. Essential hypertension - on lisinopril    No orders of the defined types were placed in this encounter.   Meds: No orders of the defined types were placed in this encounter.   Follow-up: No follow-ups on file.  Sprague, Kaitlyn E, RN 10/08/2023 3:36 PM

## 2023-10-09 ENCOUNTER — Ambulatory Visit
Admission: RE | Admit: 2023-10-09 | Discharge: 2023-10-09 | Disposition: A | Discharge status: Home / Self Care | Source: Ambulatory Visit | Attending: Certified Nurse Midwife | Admitting: Certified Nurse Midwife

## 2023-10-09 DIAGNOSIS — Z1231 Encounter for screening mammogram for malignant neoplasm of breast: Secondary | ICD-10-CM

## 2023-10-11 ENCOUNTER — Ambulatory Visit (HOSPITAL_BASED_OUTPATIENT_CLINIC_OR_DEPARTMENT_OTHER): Admitting: Family

## 2023-10-11 ENCOUNTER — Encounter (HOSPITAL_BASED_OUTPATIENT_CLINIC_OR_DEPARTMENT_OTHER): Payer: Self-pay | Admitting: Family

## 2023-10-11 VITALS — BP 100/70 | HR 86 | Ht <= 58 in | Wt 146.0 lb

## 2023-10-11 DIAGNOSIS — Q969 Turner's syndrome, unspecified: Secondary | ICD-10-CM

## 2023-10-11 DIAGNOSIS — I2510 Atherosclerotic heart disease of native coronary artery without angina pectoris: Secondary | ICD-10-CM

## 2023-10-11 DIAGNOSIS — E785 Hyperlipidemia, unspecified: Secondary | ICD-10-CM

## 2023-10-11 DIAGNOSIS — I10 Essential (primary) hypertension: Secondary | ICD-10-CM | POA: Diagnosis not present

## 2023-10-11 DIAGNOSIS — Q278 Other specified congenital malformations of peripheral vascular system: Secondary | ICD-10-CM | POA: Diagnosis not present

## 2023-10-11 MED ORDER — ROSUVASTATIN CALCIUM 10 MG PO TABS: 10.0000 mg | ORAL_TABLET | Freq: Every day | ORAL | 0 refills | Status: AC

## 2023-10-11 NOTE — Patient Instructions (Addendum)
 Medication Instructions:  Continue your current medications  *If you need a refill on your cardiac medications before your next appointment, please call your pharmacy*  Lab Work: Your physician recommends that you return for lab work within the next month or at [facility] for fasting cholesterol panel  If you have labs (blood work) drawn today and your tests are completely normal, you will receive your results only by: MyChart Message (if you have MyChart) OR A paper copy in the mail If you have any lab test that is abnormal or we need to change your treatment, we will call you to review the results.  Testing/Procedures: Your EKG today looked good!  Your provider has recommended a CTA Aorta in July 2026  Follow-Up: At [facility], you and your health needs are our priority.  As part of our continuing mission to provide you with exceptional heart care, our providers are all part of one team.  This team includes your primary Cardiologist (physician) and Advanced Practice Providers or APPs (Physician Assistants and Nurse Practitioners) who all work together to provide you with the care you need, when you need it.  Your next appointment:   1 year(s)  Provider:   Annabella Scarce, MD, Rosaline Bane, NP, or Reche Finder, NP    We recommend signing up for the patient portal called MyChart.  Sign up information is provided on this After Visit Summary.  MyChart is used to connect with patients for Virtual Visits (Telemedicine).  Patients are able to view lab/test results, encounter notes, upcoming appointments, etc.  Non-urgent messages can be sent to your provider as well.   To learn more about what you can do with MyChart, go to [email or web address].   Other Instructions  Heart Healthy Diet Recommendations: A low-salt diet is recommended. Meats should be grilled, baked, or boiled. Avoid fried foods. Focus on lean protein sources like fish or chicken with vegetables and  fruits. The American Heart Association is a Chief Technology Officer!  American Heart Association Diet and Lifeystyle Recommendations   Exercise recommendations: The American Heart Association recommends 150 minutes of moderate intensity exercise weekly. Try 30 minutes of moderate intensity exercise 4-5 times per week. This could include walking, jogging, or swimming.  [facility] [employer] locations in [place]:   [facility] [address] [place],  [place]  [address]  [place]  PHONE: [phone]  [facility] [address] [place],  [place]  [address]  [place]  PHONE: [phone]  [facility] [address] [place],  [place]  [address]  [place]  PHONE: [phone] View Store Details

## 2023-10-11 NOTE — Progress Notes (Signed)
 Cardiology Office Note:  .   Date:  10/11/2023  ID:  Mercedes Barry, DOB [birth year], MRN [id number] PCP: Burney Darice CROME, MD  [facility] Providers Cardiologist:  Annabella Scarce, MD    History of Present Illness: .   Mercedes Barry is a 48 y.o. female with hx of Turner's syndrome, HTN, hypothyroidism, coronary artery disease, DM2, hypothyroidism.   Prior echocardiogram 08/11/2020 LVEF 60 to 65%, no RWMA, mild LVH, RV normal, bilateral atria moderately dilated, tricuspid AV with no significant valvular abnormalities. Myoview  08/2020 low risk.   CT Aorta 12/2020 moderate coronary atherosclerosis (most significant in LAD), no aortic aneurysm, left subclavian artery dilation to 1.7 cm.  She was started on rosuvastatin .  Chest CTA 07/2021 showed left subclavian ectasia was stable.  Seen 09/20/21 doing well from a cardiac perspective.  Updated CTA 08/11/2022 was stable with no change in ectatic left subclavian artery recommended to consider repeat imaging in 1-2 years to confirm continued stability.   She was last seen 08/20/22 doing well from cardiac perspective with successful weight loss.  She was provided resources for low carbohydrate diet and increasing physical activity.  Presents in follow-up with family.  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 She is getting married in March. She is now living in [place]. In June had episode of dizziness which was found to be related to her blood sugar. Q1c yesterday of 6.9. She is walking for exercise. She had an appointment yesterday with  new endocrinologist, Dr. Becki and Harlene An, NP - she was started on Ozempic as did not tolerate Metformin. Had labs earlier this week with normal kdney and liver function. Her vitamin D  and TSH were low, awaiting recommendations.   ROS: Please see the history of present illness.    All other systems reviewed and are negative.   Studies  Reviewed: SABRA   EKG Interpretation Date/Time:  Friday October 11 2023 10:35:49 EDT Ventricular Rate:  86 PR Interval:  122 QRS Duration:  82 QT Interval:  366 QTC Calculation: 437 R Axis:   45  Text Interpretation: Normal sinus rhythm Low voltage QRS Confirmed by Vannie Mora ([id number]) on 10/11/2023 10:40:39 AM    Cardiac Studies & Procedures   ______________________________________________________________________________________________   STRESS TESTS  MYOCARDIAL PERFUSION IMAGING 09/02/2020  Interpretation Summary  The left ventricular ejection fraction is normal (55-65%).  Nuclear stress EF: 64%.  Downsloping ST segment depression ST segment depression was noted during stress in the II, III, aVF, V3, V4 and V5 leads, and returning to baseline after less than 1 minute of recovery.  The study is normal.  This is a low risk study.  Overall low counts intracardiac with significant extracardiac activity, may limit evaluation. No apparent reversible ischemia. ST changes during infusion of lexiscan .   ECHOCARDIOGRAM  ECHOCARDIOGRAM LIMITED BUBBLE STUDY 09/20/2020  Narrative ECHOCARDIOGRAM LIMITED REPORT    Patient Name:   Mercedes Barry Date of Exam: 09/20/2020 Medical Rec #:  [id number]          Height:       57.0 in Accession #:    [id number]         Weight:       129.0 lb Date of Birth:  Jan 28, 1975           BSA:          1.493 m Patient Age:    45 years  BP:           140/82 mmHg Patient Gender: F                  HR:           96 bpm. Exam Location:  [address]  Procedure: Limited Echo and Saline Contrast Bubble Study  Indications:     R01.1 Murmur  History:         Patient has prior history of Echocardiogram examinations, most recent 08/19/2020. Signs/Symptoms:Murmur; Risk Factors:Hypertension. Dilated Coronary Sinus- Rule out Persistent Left SVC, Turner's Syndrome.  Sonographer:     Heather Hawks RDCS Referring Phys:  CYRUS JENNIFER SAUNDERS  [facility] Diagnosing Phys: Aleene Passe MD   Sonographer Comments: IV inserted into Left AC with Bubble injection to rule out Persistent Left SVC IMPRESSIONS   1. The coronary sinus does appear prominant. 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 The images are somewhat difficult because of multiple bubble injections and suboptimal image quality.  FINDINGS Left Atrium: Left atrial size was normal in size.  IAS/Shunts: No atrial level shunt detected by color flow Doppler. Agitated saline contrast was given intravenously to evaluate for intracardiac shunting.  Aleene Passe MD Electronically signed by Aleene Passe MD Signature Date/Time: 09/20/2020/3:26:56 PM    Final (Updated)          ______________________________________________________________________________________________        Risk Assessment/Calculations:             Physical Exam:   VS:  BP 100/70   Pulse 86   Ht 4' 9 (1.448 m)   Wt 146 lb (66.2 kg)   SpO2 97%   BMI 31.59 kg/m    Wt Readings from Last 3 Encounters:  10/11/23 146 lb (66.2 kg)  10/08/23 144 lb (65.3 kg)  07/22/23 137 lb 6.4 oz (62.3 kg)    GEN: Well nourished, well developed in no acute distress NECK: No JVD; No carotid bruits CARDIAC: RRR, no murmurs, rubs, gallops RESPIRATORY:  Clear to auscultation without rales, wheezing or rhonchi  ABDOMEN: Soft, non-tender, non-distended EXTREMITIES:  No edema; No deformity   ASSESSMENT AND PLAN: .    CAD- Stable with no anginal symptoms. No indication for ischemic evaluation.  EKG today NSR 86 with no acute ST/T wave changes.  GDMT includes rosuvastatin  10 mg daily. Recommend aiming for 150 minutes of moderate intensity activity per week and following a heart healthy diet.    HLD, LDL goal less than 70 - Continue Rosuvastatin  10mg  daily. Due for repeat lipid panel, she will have collected at  [facility] within the next month.   HTN- BP well controlled. Continue current antihypertensive regimen Lisinopril  10mg  daily. Relatively hypotensive but asymptomatic.   Turner's Syndrome / Subclavian artery dilation (1.7 cm) - stable 07/2021 and 07/2022 by CT imaing. Plan for repeat CT Aorta 07/2024.   Continue optimal BP control.   DM2 - Recently established with Harlene An, NP and started on Ozempic.        Dispo: follow up in 1 year  Signed, Reche GORMAN Finder, NP

## 2024-10-12 ENCOUNTER — Ambulatory Visit (HOSPITAL_BASED_OUTPATIENT_CLINIC_OR_DEPARTMENT_OTHER): Admitting: Obstetrics & Gynecology

## 2024-10-12 ENCOUNTER — Other Ambulatory Visit (HOSPITAL_BASED_OUTPATIENT_CLINIC_OR_DEPARTMENT_OTHER)
